# Patient Record
Sex: Male | Born: 1935 | Race: White | Hispanic: No | Marital: Married | Smoking: Former smoker
Health system: Southern US, Community
[De-identification: ages and names within clinical notes are randomized; demographics above are authoritative.]

## PROBLEM LIST (undated history)

## (undated) DIAGNOSIS — M1990 Unspecified osteoarthritis, unspecified site: Secondary | ICD-10-CM

## (undated) DIAGNOSIS — D696 Thrombocytopenia, unspecified: Secondary | ICD-10-CM

## (undated) DIAGNOSIS — F1020 Alcohol dependence, uncomplicated: Secondary | ICD-10-CM

## (undated) DIAGNOSIS — I82409 Acute embolism and thrombosis of unspecified deep veins of unspecified lower extremity: Secondary | ICD-10-CM

## (undated) DIAGNOSIS — K439 Ventral hernia without obstruction or gangrene: Secondary | ICD-10-CM

## (undated) DIAGNOSIS — I739 Peripheral vascular disease, unspecified: Secondary | ICD-10-CM

## (undated) DIAGNOSIS — C801 Malignant (primary) neoplasm, unspecified: Secondary | ICD-10-CM

## (undated) DIAGNOSIS — N320 Bladder-neck obstruction: Secondary | ICD-10-CM

## (undated) DIAGNOSIS — I509 Heart failure, unspecified: Secondary | ICD-10-CM

## (undated) DIAGNOSIS — E785 Hyperlipidemia, unspecified: Secondary | ICD-10-CM

## (undated) DIAGNOSIS — F17200 Nicotine dependence, unspecified, uncomplicated: Secondary | ICD-10-CM

## (undated) DIAGNOSIS — I779 Disorder of arteries and arterioles, unspecified: Secondary | ICD-10-CM

## (undated) DIAGNOSIS — E119 Type 2 diabetes mellitus without complications: Secondary | ICD-10-CM

## (undated) DIAGNOSIS — I10 Essential (primary) hypertension: Secondary | ICD-10-CM

## (undated) DIAGNOSIS — I2510 Atherosclerotic heart disease of native coronary artery without angina pectoris: Secondary | ICD-10-CM

## (undated) DIAGNOSIS — R5381 Other malaise: Secondary | ICD-10-CM

## (undated) DIAGNOSIS — R296 Repeated falls: Secondary | ICD-10-CM

## (undated) DIAGNOSIS — W19XXXA Unspecified fall, initial encounter: Secondary | ICD-10-CM

## (undated) DIAGNOSIS — I639 Cerebral infarction, unspecified: Secondary | ICD-10-CM

## (undated) HISTORY — DX: Ventral hernia without obstruction or gangrene: K43.9

## (undated) HISTORY — DX: Nicotine dependence, unspecified, uncomplicated: F17.200

## (undated) HISTORY — DX: Hyperlipidemia, unspecified: E78.5

## (undated) HISTORY — DX: Type 2 diabetes mellitus without complications: E11.9

## (undated) HISTORY — PX: PROSTATE SURGERY: SHX751

## (undated) HISTORY — DX: Alcohol dependence, uncomplicated: F10.20

## (undated) HISTORY — DX: Bladder-neck obstruction: N32.0

## (undated) HISTORY — DX: Essential (primary) hypertension: I10

## (undated) HISTORY — DX: Peripheral vascular disease, unspecified: I73.9

## (undated) HISTORY — DX: Cerebral infarction, unspecified: I63.9

## (undated) HISTORY — DX: Atherosclerotic heart disease of native coronary artery without angina pectoris: I25.10

## (undated) HISTORY — DX: Malignant (primary) neoplasm, unspecified: C80.1

## (undated) HISTORY — DX: Unspecified osteoarthritis, unspecified site: M19.90

## (undated) HISTORY — PX: FOOT SURGERY: SHX648

## (undated) HISTORY — DX: Acute embolism and thrombosis of unspecified deep veins of unspecified lower extremity: I82.409

## (undated) HISTORY — DX: Unspecified fall, initial encounter: W19.XXXA

## (undated) HISTORY — DX: Thrombocytopenia, unspecified: D69.6

## (undated) HISTORY — DX: Disorder of arteries and arterioles, unspecified: I77.9

## (undated) HISTORY — DX: Other malaise: R53.81

## (undated) HISTORY — DX: Repeated falls: R29.6

---

## 1979-03-03 HISTORY — PX: SHOULDER FUSION SURGERY: SHX775

## 1996-07-02 DIAGNOSIS — I639 Cerebral infarction, unspecified: Secondary | ICD-10-CM

## 1996-07-02 HISTORY — PX: CAROTID ENDARTERECTOMY: SUR193

## 1996-07-02 HISTORY — DX: Cerebral infarction, unspecified: I63.9

## 1998-08-23 ENCOUNTER — Encounter (HOSPITAL_COMMUNITY): Admission: RE | Admit: 1998-08-23 | Discharge: 1998-11-21 | Payer: Self-pay | Admitting: *Deleted

## 1999-09-08 ENCOUNTER — Encounter (INDEPENDENT_AMBULATORY_CARE_PROVIDER_SITE_OTHER): Payer: Self-pay

## 1999-09-08 ENCOUNTER — Inpatient Hospital Stay (HOSPITAL_COMMUNITY): Admission: EM | Admit: 1999-09-08 | Discharge: 1999-09-12 | Payer: Self-pay | Admitting: Emergency Medicine

## 1999-09-08 ENCOUNTER — Encounter: Payer: Self-pay | Admitting: Gastroenterology

## 1999-09-08 ENCOUNTER — Encounter: Payer: Self-pay | Admitting: Emergency Medicine

## 1999-09-08 ENCOUNTER — Encounter: Payer: Self-pay | Admitting: Pulmonary Disease

## 2000-12-26 ENCOUNTER — Other Ambulatory Visit: Admission: RE | Admit: 2000-12-26 | Discharge: 2000-12-26 | Payer: Self-pay | Admitting: Urology

## 2000-12-26 ENCOUNTER — Encounter (INDEPENDENT_AMBULATORY_CARE_PROVIDER_SITE_OTHER): Payer: Self-pay | Admitting: Specialist

## 2001-07-02 HISTORY — PX: CAROTID ENDARTERECTOMY: SUR193

## 2001-10-20 ENCOUNTER — Encounter: Payer: Self-pay | Admitting: Vascular Surgery

## 2001-10-22 ENCOUNTER — Ambulatory Visit (HOSPITAL_COMMUNITY): Admission: RE | Admit: 2001-10-22 | Discharge: 2001-10-22 | Payer: Self-pay | Admitting: Vascular Surgery

## 2001-10-27 ENCOUNTER — Encounter (INDEPENDENT_AMBULATORY_CARE_PROVIDER_SITE_OTHER): Payer: Self-pay | Admitting: Specialist

## 2001-10-27 ENCOUNTER — Inpatient Hospital Stay (HOSPITAL_COMMUNITY): Admission: RE | Admit: 2001-10-27 | Discharge: 2001-10-28 | Payer: Self-pay | Admitting: Vascular Surgery

## 2004-02-15 ENCOUNTER — Ambulatory Visit: Admission: RE | Admit: 2004-02-15 | Discharge: 2004-05-15 | Payer: Self-pay | Admitting: Radiation Oncology

## 2004-04-05 ENCOUNTER — Ambulatory Visit (HOSPITAL_COMMUNITY): Admission: RE | Admit: 2004-04-05 | Discharge: 2004-04-05 | Payer: Self-pay | Admitting: Urology

## 2004-04-05 ENCOUNTER — Ambulatory Visit (HOSPITAL_BASED_OUTPATIENT_CLINIC_OR_DEPARTMENT_OTHER): Admission: RE | Admit: 2004-04-05 | Discharge: 2004-04-05 | Payer: Self-pay | Admitting: Urology

## 2004-06-21 ENCOUNTER — Ambulatory Visit: Payer: Self-pay | Admitting: Internal Medicine

## 2004-07-26 ENCOUNTER — Ambulatory Visit: Payer: Self-pay | Admitting: Internal Medicine

## 2004-08-02 ENCOUNTER — Ambulatory Visit: Payer: Self-pay | Admitting: Internal Medicine

## 2004-10-06 ENCOUNTER — Ambulatory Visit: Payer: Self-pay | Admitting: Internal Medicine

## 2005-04-23 ENCOUNTER — Ambulatory Visit (HOSPITAL_COMMUNITY): Admission: RE | Admit: 2005-04-23 | Discharge: 2005-04-23 | Payer: Self-pay | Admitting: Vascular Surgery

## 2005-05-02 ENCOUNTER — Ambulatory Visit (HOSPITAL_COMMUNITY): Admission: RE | Admit: 2005-05-02 | Discharge: 2005-05-02 | Payer: Self-pay | Admitting: Vascular Surgery

## 2005-08-23 ENCOUNTER — Encounter: Admission: RE | Admit: 2005-08-23 | Discharge: 2005-11-21 | Payer: Self-pay | Admitting: Family Medicine

## 2007-06-04 ENCOUNTER — Encounter: Admission: RE | Admit: 2007-06-04 | Discharge: 2007-09-02 | Payer: Self-pay | Admitting: Family Medicine

## 2007-09-02 ENCOUNTER — Emergency Department (HOSPITAL_COMMUNITY): Admission: EM | Admit: 2007-09-02 | Discharge: 2007-09-02 | Payer: Self-pay | Admitting: Emergency Medicine

## 2010-05-08 ENCOUNTER — Ambulatory Visit (HOSPITAL_COMMUNITY): Admission: RE | Admit: 2010-05-08 | Discharge: 2010-05-08 | Payer: Self-pay | Admitting: Cardiology

## 2010-05-15 ENCOUNTER — Ambulatory Visit: Payer: Self-pay | Admitting: Surgery

## 2010-05-16 ENCOUNTER — Encounter: Payer: Self-pay | Admitting: Internal Medicine

## 2010-05-16 ENCOUNTER — Ambulatory Visit (HOSPITAL_COMMUNITY): Admission: RE | Admit: 2010-05-16 | Discharge: 2010-05-16 | Payer: Self-pay | Admitting: Surgery

## 2010-05-23 ENCOUNTER — Ambulatory Visit: Payer: Self-pay | Admitting: Vascular Surgery

## 2010-05-23 ENCOUNTER — Encounter: Payer: Self-pay | Admitting: Internal Medicine

## 2010-05-24 DIAGNOSIS — R011 Cardiac murmur, unspecified: Secondary | ICD-10-CM | POA: Insufficient documentation

## 2010-05-24 DIAGNOSIS — E119 Type 2 diabetes mellitus without complications: Secondary | ICD-10-CM | POA: Insufficient documentation

## 2010-05-24 DIAGNOSIS — I10 Essential (primary) hypertension: Secondary | ICD-10-CM | POA: Insufficient documentation

## 2010-05-24 DIAGNOSIS — I6350 Cerebral infarction due to unspecified occlusion or stenosis of unspecified cerebral artery: Secondary | ICD-10-CM | POA: Insufficient documentation

## 2010-05-24 DIAGNOSIS — E785 Hyperlipidemia, unspecified: Secondary | ICD-10-CM | POA: Insufficient documentation

## 2010-05-24 DIAGNOSIS — I2510 Atherosclerotic heart disease of native coronary artery without angina pectoris: Secondary | ICD-10-CM | POA: Insufficient documentation

## 2010-05-24 DIAGNOSIS — C61 Malignant neoplasm of prostate: Secondary | ICD-10-CM

## 2010-05-26 ENCOUNTER — Ambulatory Visit: Payer: Self-pay | Admitting: Internal Medicine

## 2010-05-26 DIAGNOSIS — R059 Cough, unspecified: Secondary | ICD-10-CM | POA: Insufficient documentation

## 2010-05-26 DIAGNOSIS — R05 Cough: Secondary | ICD-10-CM

## 2010-06-13 ENCOUNTER — Ambulatory Visit: Payer: Self-pay | Admitting: Surgery

## 2010-06-13 ENCOUNTER — Ambulatory Visit: Payer: Self-pay | Admitting: Internal Medicine

## 2010-06-13 DIAGNOSIS — F17200 Nicotine dependence, unspecified, uncomplicated: Secondary | ICD-10-CM

## 2010-07-02 HISTORY — PX: CORONARY ARTERY BYPASS GRAFT: SHX141

## 2010-07-26 LAB — CBC
HCT: 47.2 % (ref 39.0–52.0)
Hemoglobin: 15.9 g/dL (ref 13.0–17.0)
MCHC: 33.7 g/dL (ref 30.0–36.0)
RBC: 5.05 MIL/uL (ref 4.22–5.81)
WBC: 10.5 10*3/uL (ref 4.0–10.5)

## 2010-07-26 LAB — URINALYSIS, ROUTINE W REFLEX MICROSCOPIC
Ketones, ur: NEGATIVE mg/dL
Leukocytes, UA: NEGATIVE
Nitrite: NEGATIVE
Protein, ur: NEGATIVE mg/dL
pH: 6 (ref 5.0–8.0)

## 2010-07-26 LAB — COMPREHENSIVE METABOLIC PANEL
ALT: 34 U/L (ref 0–53)
Albumin: 4.2 g/dL (ref 3.5–5.2)
Alkaline Phosphatase: 84 U/L (ref 39–117)
Calcium: 9.8 mg/dL (ref 8.4–10.5)
GFR calc Af Amer: >60 mL/min (ref >60)
Glucose, Bld: 317 mg/dL — ABNORMAL HIGH (ref 70–99)
Potassium: 5.1 mEq/L (ref 3.5–5.1)
Sodium: 137 mEq/L (ref 135–145)
Total Protein: 6.2 g/dL (ref 6.0–8.3)

## 2010-07-26 LAB — BLOOD GAS, ARTERIAL
Bicarbonate: 23.9 mEq/L (ref 20.0–24.0)
FIO2: 0.21 %
Patient temperature: 98.6
TCO2: 25 mmol/L (ref 0–100)
pH, Arterial: 7.409 (ref 7.350–7.450)
pO2, Arterial: 57.7 mmHg — ABNORMAL LOW (ref 80.0–100.0)

## 2010-07-26 LAB — APTT: aPTT: 30 seconds (ref 24–37)

## 2010-07-26 LAB — URINE MICROSCOPIC-ADD ON

## 2010-07-26 LAB — HEMOGLOBIN A1C: Hgb A1c MFr Bld: 9.6 % — ABNORMAL HIGH (ref <5.7)

## 2010-07-26 LAB — PROTIME-INR
INR: 0.89 (ref 0.00–1.49)
Prothrombin Time: 12.3 seconds (ref 11.6–15.2)

## 2010-07-26 LAB — SURGICAL PCR SCREEN: MRSA, PCR: NEGATIVE

## 2010-07-27 ENCOUNTER — Inpatient Hospital Stay (HOSPITAL_COMMUNITY)
Admission: RE | Admit: 2010-07-27 | Discharge: 2010-08-01 | Disposition: A | Discharge status: Home / Self Care | Payer: Medicare Other | Source: Home / Self Care | Attending: Surgery | Admitting: Surgery

## 2010-07-27 HISTORY — PX: PR VEIN BYPASS GRAFT,AORTO-FEM-POP: 35551

## 2010-07-27 HISTORY — PX: CAROTID ENDARTERECTOMY: SUR193

## 2010-07-27 LAB — GLUCOSE, POCT (MANUAL RESULT ENTRY): Operator id: 132841

## 2010-07-27 LAB — PLATELET COUNT: Platelets: 123 10*3/uL — ABNORMAL LOW (ref 150–400)

## 2010-07-27 LAB — POCT I-STAT 4, (NA,K, GLUC, HGB,HCT)
Glucose, Bld: 162 mg/dL — ABNORMAL HIGH (ref 70–99)
HCT: 34 % — ABNORMAL LOW (ref 39.0–52.0)
HCT: 28 % — ABNORMAL LOW (ref 39.0–52.0)
Hemoglobin: 13.3 g/dL (ref 13.0–17.0)
Hemoglobin: 8.8 g/dL — ABNORMAL LOW (ref 13.0–17.0)
Hemoglobin: 9.5 g/dL — ABNORMAL LOW (ref 13.0–17.0)
Potassium: 4.7 mEq/L (ref 3.5–5.1)
Potassium: 4.4 mEq/L (ref 3.5–5.1)
Potassium: 4.4 mEq/L (ref 3.5–5.1)
Sodium: 140 mEq/L (ref 135–145)
Sodium: 137 mEq/L (ref 135–145)
Sodium: 140 mEq/L (ref 135–145)

## 2010-07-27 LAB — POCT I-STAT 3, ART BLOOD GAS (G3+)
Acid-base deficit: 4 mmol/L — ABNORMAL HIGH (ref 0.0–2.0)
Acid-base deficit: 4 mmol/L — ABNORMAL HIGH (ref 0.0–2.0)
Bicarbonate: 25.7 mEq/L — ABNORMAL HIGH (ref 20.0–24.0)
Bicarbonate: 22.4 mEq/L (ref 20.0–24.0)
Patient temperature: 37.2
pCO2 arterial: 47.4 mmHg — ABNORMAL HIGH (ref 35.0–45.0)
pH, Arterial: 7.342 — ABNORMAL LOW (ref 7.350–7.450)
pH, Arterial: 7.285 — ABNORMAL LOW (ref 7.350–7.450)
pH, Arterial: 7.286 — ABNORMAL LOW (ref 7.350–7.450)
pO2, Arterial: 296 mmHg — ABNORMAL HIGH (ref 80.0–100.0)
pO2, Arterial: 68 mmHg — ABNORMAL LOW (ref 80.0–100.0)

## 2010-07-27 LAB — GLUCOSE, CAPILLARY
Glucose-Capillary: 94 mg/dL (ref 70–99)
Glucose-Capillary: 96 mg/dL (ref 70–99)
Glucose-Capillary: 115 mg/dL — ABNORMAL HIGH (ref 70–99)

## 2010-07-27 LAB — HEMOGLOBIN AND HEMATOCRIT, BLOOD
HCT: 29.1 % — ABNORMAL LOW (ref 39.0–52.0)
Hemoglobin: 10.1 g/dL — ABNORMAL LOW (ref 13.0–17.0)

## 2010-07-27 LAB — CBC
HCT: 35.9 % — ABNORMAL LOW (ref 39.0–52.0)
Hemoglobin: 12.4 g/dL — ABNORMAL LOW (ref 13.0–17.0)
MCH: 32.1 pg (ref 26.0–34.0)
MCV: 95 fL (ref 78.0–100.0)
Platelets: 118 10*3/uL — ABNORMAL LOW (ref 150–400)
RBC: 3.86 MIL/uL — ABNORMAL LOW (ref 4.22–5.81)
RBC: 3.78 MIL/uL — ABNORMAL LOW (ref 4.22–5.81)
RDW: 14.4 % (ref 11.5–15.5)
WBC: 15.6 10*3/uL — ABNORMAL HIGH (ref 4.0–10.5)

## 2010-07-27 LAB — PROTIME-INR
INR: 1.25 (ref 0.00–1.49)
Prothrombin Time: 15.9 seconds — ABNORMAL HIGH (ref 11.6–15.2)

## 2010-07-27 LAB — POCT I-STAT, CHEM 8
BUN: 18 mg/dL (ref 6–23)
Calcium, Ion: 1.21 mmol/L (ref 1.12–1.32)
Chloride: 110 mEq/L (ref 96–112)
HCT: 35 % — ABNORMAL LOW (ref 39.0–52.0)
Sodium: 140 mEq/L (ref 135–145)
TCO2: 24 mmol/L (ref 0–100)

## 2010-07-27 LAB — CREATININE, SERUM: GFR calc Af Amer: >60 mL/min (ref >60)

## 2010-07-28 LAB — GLUCOSE, CAPILLARY
Glucose-Capillary: 219 mg/dL — ABNORMAL HIGH (ref 70–99)
Glucose-Capillary: 224 mg/dL — ABNORMAL HIGH (ref 70–99)
Glucose-Capillary: 166 mg/dL — ABNORMAL HIGH (ref 70–99)
Glucose-Capillary: 235 mg/dL — ABNORMAL HIGH (ref 70–99)
Glucose-Capillary: 250 mg/dL — ABNORMAL HIGH (ref 70–99)

## 2010-07-28 LAB — CBC
HCT: 35.3 % — ABNORMAL LOW (ref 39.0–52.0)
HCT: 34.8 % — ABNORMAL LOW (ref 39.0–52.0)
Hemoglobin: 11.6 g/dL — ABNORMAL LOW (ref 13.0–17.0)
MCH: 31.4 pg (ref 26.0–34.0)
MCV: 96.7 fL (ref 78.0–100.0)
Platelets: 94 10*3/uL — ABNORMAL LOW (ref 150–400)
RBC: 3.68 MIL/uL — ABNORMAL LOW (ref 4.22–5.81)
RBC: 3.6 MIL/uL — ABNORMAL LOW (ref 4.22–5.81)
RDW: 14.6 % (ref 11.5–15.5)
WBC: 12.6 10*3/uL — ABNORMAL HIGH (ref 4.0–10.5)

## 2010-07-28 LAB — BASIC METABOLIC PANEL
CO2: 25 mEq/L (ref 19–32)
Chloride: 108 mEq/L (ref 96–112)
Glucose, Bld: 232 mg/dL — ABNORMAL HIGH (ref 70–99)
Potassium: 4.9 mEq/L (ref 3.5–5.1)
Sodium: 141 mEq/L (ref 135–145)

## 2010-07-28 LAB — POCT I-STAT 3, ART BLOOD GAS (G3+)
Acid-base deficit: 3 mmol/L — ABNORMAL HIGH (ref 0.0–2.0)
Bicarbonate: 23.3 mEq/L (ref 20.0–24.0)
pCO2 arterial: 46.2 mmHg — ABNORMAL HIGH (ref 35.0–45.0)
pH, Arterial: 7.314 — ABNORMAL LOW (ref 7.350–7.450)
pO2, Arterial: 67 mmHg — ABNORMAL LOW (ref 80.0–100.0)

## 2010-07-28 LAB — TYPE AND SCREEN
ABO/RH(D): O POS
Unit division: 0
Unit division: 0

## 2010-07-28 LAB — MAGNESIUM: Magnesium: 2.4 mg/dL (ref 1.5–2.5)

## 2010-07-28 NOTE — Op Note (Signed)
NAME:  [name] [name], [name] [name]             ACCOUNT NO.:  [id number]  MEDICAL RECORD NO.:  [id number]          PATIENT TYPE:  INP  LOCATION:  2306                         FACILITY:  [facility]  PHYSICIAN:  Larina Earthly, M.D.    DATE OF BIRTH:  April 07, 1935  DATE OF PROCEDURE:  07/27/2010 DATE OF DISCHARGE:                              OPERATIVE REPORT   PREOPERATIVE DIAGNOSES:  Severe asymptomatic right internal carotid artery stenosis and severe coronary artery disease.  POSTOPERATIVE DIAGNOSES:  Severe asymptomatic right internal carotid artery stenosis and severe coronary artery disease.  PROCEDURE:  Right carotid endarterectomy, Dacron patch angioplasty.  SURGEON:  Larina Earthly, MD  ASSISTANT:  Rowe Clack, PA-C.  ANESTHESIA:  General tracheal.  COMPLICATIONS:  None.  DISPOSITION:  The patient will then subsequently undergo coronary artery bypass grafting, which will be dictated in separate note by Dr. Evelene Croon  PROCEDURE IN DETAIL:  The patient was taken to operating room, placed in supine position where the area of the right neck, chest, both legs were prepped and draped in usual sterile fashion.  Incision was made in the anterior sternocleidomastoid and carried down through the platysma with electrocautery.  The sternocleidomastoid reflected posteriorly and the carotid sheath was opened.  Facial vein was ligated with 2-0 silk ties and divided.  The common carotid artery was encircled with umbilical tape Rumel tourniquet.  Dissection was carried down to the bifurcation. The superior thyroid artery was encircled with 2-0 silk Potts tie.  The external carotid was encircled with the blue vessel loop, and the internal carotid was encircled with an umbilical tape and Rumel tourniquet.  Hypoglossal and vagus nerves were identified and preserved. The patient was given 9000 units of intravenous heparin.  After adequate circulation time, the internal, external, and common carotid  arteries were occluded.  The common carotid arteries were opened with an 11 blade and extended longitudinally with Potts scissors through the plaque onto the internal carotid artery.  The patient had extensive calcified plaque at the internal carotid artery.  The artery above this was normal.  A 10 shunt was passed up the internal carotid artery allowed to back bleed and then down the common carotid were secured with Rumel tourniquets. The endarterectomy was begun on the common carotid artery, and the plaque was divided proximally with Potts scissors.  The endarterectomy carried down to the bifurcation.  The external carotid endarterectomized with eversion technique, and the internal carotid endarterectomized in open fashion.  Remaining atheromatous debris was removed from endarterectomy plane.  A Hemashield Finesse Dacron patch was brought onto the field and sewn as a patch angioplasty with a running 6-0 Prolene suture.  Prior to completion of the anastomosis, shunt was removed and the usual flushing maneuvers were undertaken.  Anastomosis completed and the flow restored first to the external and internal carotid artery.  Excellent flow characteristics were noted with hand- held Dopplers in the internal and external carotid arteries.  The patient was not given any protamines since he was not going to undergo coronary artery bypass grafting.  A Surgicel and Ray-Tec sponge was placed over the anastomosis, and the skin  was temporary closed with 2-0 nylon mattress sutures.  The patient then will undergo a coronary artery bypass grafting, which will be dictated as a separate note by Dr. Laneta Simmers.     Larina Earthly, M.D.     TFE/MEDQ  D:  07/27/2010  T:  07/28/2010  Job:  [id number]  cc:   Cristy Hilts. Jacinto Halim, MD  Electronically Signed by Kaity Pitstick M.D. on 07/28/2010 03:37:36 PM

## 2010-07-29 LAB — GLUCOSE, CAPILLARY
Glucose-Capillary: 183 mg/dL — ABNORMAL HIGH (ref 70–99)
Glucose-Capillary: 262 mg/dL — ABNORMAL HIGH (ref 70–99)
Glucose-Capillary: 325 mg/dL — ABNORMAL HIGH (ref 70–99)
Glucose-Capillary: 192 mg/dL — ABNORMAL HIGH (ref 70–99)

## 2010-07-29 LAB — CBC
HCT: 35.4 % — ABNORMAL LOW (ref 39.0–52.0)
Hemoglobin: 11.5 g/dL — ABNORMAL LOW (ref 13.0–17.0)
MCV: 97.3 fL (ref 78.0–100.0)
RBC: 3.64 MIL/uL — ABNORMAL LOW (ref 4.22–5.81)
WBC: 14.1 10*3/uL — ABNORMAL HIGH (ref 4.0–10.5)

## 2010-07-29 LAB — BASIC METABOLIC PANEL
BUN: 21 mg/dL (ref 6–23)
CO2: 29 mEq/L (ref 19–32)
Chloride: 101 mEq/L (ref 96–112)
Glucose, Bld: 210 mg/dL — ABNORMAL HIGH (ref 70–99)
Potassium: 4.8 mEq/L (ref 3.5–5.1)

## 2010-07-30 LAB — GLUCOSE, CAPILLARY: Glucose-Capillary: 151 mg/dL — ABNORMAL HIGH (ref 70–99)

## 2010-07-30 LAB — CBC
HCT: 34.6 % — ABNORMAL LOW (ref 39.0–52.0)
Hemoglobin: 11.2 g/dL — ABNORMAL LOW (ref 13.0–17.0)
MCH: 31.2 pg (ref 26.0–34.0)
MCHC: 32.4 g/dL (ref 30.0–36.0)
MCV: 96.4 fL (ref 78.0–100.0)
Platelets: 99 10*3/uL — ABNORMAL LOW (ref 150–400)
RBC: 3.59 MIL/uL — ABNORMAL LOW (ref 4.22–5.81)
RDW: 14.1 % (ref 11.5–15.5)
WBC: 11.6 10*3/uL — ABNORMAL HIGH (ref 4.0–10.5)

## 2010-07-30 LAB — COMPREHENSIVE METABOLIC PANEL
ALT: 28 U/L (ref 0–53)
AST: 29 U/L (ref 0–37)
Albumin: 2.8 g/dL — ABNORMAL LOW (ref 3.5–5.2)
Alkaline Phosphatase: 47 U/L (ref 39–117)
BUN: 25 mg/dL — ABNORMAL HIGH (ref 6–23)
CO2: 31 mEq/L (ref 19–32)
Calcium: 9 mg/dL (ref 8.4–10.5)
Chloride: 96 mEq/L (ref 96–112)
Creatinine, Ser: 0.88 mg/dL (ref 0.4–1.5)
GFR calc Af Amer: >60 mL/min (ref >60)
GFR calc non Af Amer: >60 mL/min (ref >60)
Glucose, Bld: 116 mg/dL — ABNORMAL HIGH (ref 70–99)
Potassium: 3.9 mEq/L (ref 3.5–5.1)
Sodium: 137 mEq/L (ref 135–145)
Total Bilirubin: 0.8 mg/dL (ref 0.3–1.2)
Total Protein: 5.6 g/dL — ABNORMAL LOW (ref 6.0–8.3)

## 2010-07-31 LAB — CBC
HCT: 32.9 % — ABNORMAL LOW (ref 39.0–52.0)
Hemoglobin: 10.7 g/dL — ABNORMAL LOW (ref 13.0–17.0)
MCH: 31.2 pg (ref 26.0–34.0)
MCHC: 32.5 g/dL (ref 30.0–36.0)
MCV: 95.9 fL (ref 78.0–100.0)
Platelets: 141 10*3/uL — ABNORMAL LOW (ref 150–400)
RBC: 3.43 MIL/uL — ABNORMAL LOW (ref 4.22–5.81)
RDW: 14.1 % (ref 11.5–15.5)
WBC: 9.6 10*3/uL (ref 4.0–10.5)

## 2010-07-31 LAB — BASIC METABOLIC PANEL
BUN: 29 mg/dL — ABNORMAL HIGH (ref 6–23)
CO2: 31 mEq/L (ref 19–32)
Calcium: 9.1 mg/dL (ref 8.4–10.5)
Chloride: 96 mEq/L (ref 96–112)
Creatinine, Ser: 0.94 mg/dL (ref 0.4–1.5)
GFR calc Af Amer: >60 mL/min (ref >60)
GFR calc non Af Amer: >60 mL/min (ref >60)
Glucose, Bld: 121 mg/dL — ABNORMAL HIGH (ref 70–99)
Potassium: 3.6 mEq/L (ref 3.5–5.1)
Sodium: 138 mEq/L (ref 135–145)

## 2010-07-31 LAB — GLUCOSE, CAPILLARY
Glucose-Capillary: 121 mg/dL — ABNORMAL HIGH (ref 70–99)
Glucose-Capillary: 110 mg/dL — ABNORMAL HIGH (ref 70–99)
Glucose-Capillary: 69 mg/dL — ABNORMAL LOW (ref 70–99)
Glucose-Capillary: 167 mg/dL — ABNORMAL HIGH (ref 70–99)

## 2010-08-01 LAB — GLUCOSE, CAPILLARY: Glucose-Capillary: 130 mg/dL — ABNORMAL HIGH (ref 70–99)

## 2010-08-01 NOTE — Letter (Signed)
Summary: NP Consult/[facility]  NP Consult/[facility]   Imported By: Sherian Rein 06/07/2010 12:21:48  _____________________________________________________________________  External Attachment:    Type:   Image     Comment:   External Document

## 2010-08-01 NOTE — Assessment & Plan Note (Signed)
Summary: Pulmonary/ new pt eval     Visit Type:  Initial Consult Copy to:  Dr. Evelene Croon  CC:  DOE.  History of Present Illness: 46 yowm smoker for preop eval with marked abn on insp loop only 05/16/10 while on ace inhibitors.  May 26, 2010 ov cc  indolent onset persistent minimally productive cough x 5 years no worse in am's and notes mostly with activity,  doe x flat like at a [employer], doesn't try more than that.  No noct events.  Pt denies any significant sore throat, dysphagia, itching, sneezing,  nasal congestion or excess secretions,  fever, chills, sweats, unintended wt loss, pleuritic or exertional cp, hempoptysis, change in activity tolerance  orthopnea pnd or leg swelling Pt also denies any obvious fluctuation in symptoms with weather or environmental change or other alleviating or aggravating factors.       Current Medications (verified): 1)  Multivitamins  Tabs (Multiple Vitamin) .Marland Kitchen.. 1 Once Daily 2)  Metformin Hcl 500 Mg Tabs (Metformin Hcl) .Marland Kitchen.. 1 Every Am 3)  Amlodipine Besylate 10 Mg Tabs (Amlodipine Besylate) .Marland Kitchen.. 1 Every Am 4)  Glimepiride 2 Mg Tabs (Glimepiride) .Marland Kitchen.. 1 Every Am 5)  Atenolol 50 Mg Tabs (Atenolol) .Marland Kitchen.. 1 Two Times A Day 6)  Quinapril Hcl 40 Mg Tabs (Quinapril Hcl) .Marland Kitchen.. 1 1/2 Once Daily 7)  Fish Oil 300 Mg Caps (Omega-3 Fatty Acids) .Marland Kitchen.. 1 Two Times A Day 8)  Co-Q 10 Omega-3 Fish Oil  Caps (Coenzyme Q10-Fish Oil-Vit E) .Marland Kitchen.. 1 Once Daily 9)  Lantus 100 Unit/ml Soln (Insulin Glargine) .... 66 Units At Bedtime  Allergies (verified): 1)  ! Darvon 2)  ! Demerol  Past History:  Past Medical History: Hx of PROSTATE CANCER (ICD-185) DM (ICD-250.00) CARDIAC MURMUR (ICD-785.2) HYPERLIPIDEMIA (ICD-272.4) HYPERTENSION (ICD-401.9)      - Try off ACE May 26, 2010 for pseudowheeze/cough CVA (ICD-434.91) CAD (ICD-414.00)   - LHC  05/08/10  nl ef LVEDP 19, 3 vessel CAD PFT's 05/16/10  FEV1  3.12 (88%) ratio 76  DLCO 80% with severe truncation insp  loop  Family History: Reviewed history from 05/24/2010 and no changes required. MI- Father (deceased age 10) Brother (deceased age 19) Stroke- Brother, Mother  Social History: Married Retired 4 Sons Current smoker since age 57.  Smokes 3/4 ppd. No ETOH  Review of Systems       The patient complains of shortness of breath with activity, non-productive cough, sneezing, and joint stiffness or pain.  The patient denies shortness of breath at rest, productive cough, coughing up blood, chest pain, irregular heartbeats, acid heartburn, indigestion, loss of appetite, weight change, abdominal pain, difficulty swallowing, sore throat, tooth/dental problems, headaches, nasal congestion/difficulty breathing through nose, itching, ear ache, anxiety, depression, hand/feet swelling, rash, change in color of mucus, and fever.    Vital Signs:  Patient profile:   76 year old male Height:      70 inches Weight:      211.50 pounds BMI:     30.46 O2 Sat:      95 % on Room air Temp:     97.6 degrees F oral Pulse rate:   65 / minute BP sitting:   164 / 70  (left arm)  Vitals Entered By: Vernie Murders (May 26, 2010 10:32 AM)  O2 Flow:  Room air  Physical Exam  Additional Exam:  pleasant hoarse amb wm  wt 211 May 26, 2010 HEENT mild turbinate edema.  Oropharynx no thrush or excess pnd  or cobblestoning.  No JVD or cervical adenopathy. Mild accessory muscle hypertrophy. Trachea midline, nl thryroid. Chest was hyperinflated by percussion with diminished breath sounds and moderate increased exp time without wheeze. Hoover sign positive at mid inspiration. Regular rate and rhythm without murmur gallop or rub or increase P2 or edema.  Abd: no hsm, nl excursion. Ext warm without cyanosis or clubbing.     Impression & Recommendations:  Problem # 1:  COUGH (ICD-786.2)  The most common causes of chronic cough in immunocompetent adults include: upper airway cough syndrome (UACS), previously  referred to as postnasal drip syndrome,  caused by variety of rhinosinus conditions; (2) asthma; (3) GERD; (4) chronic bronchitis from cigarette smoking or other inhaled environmental irritants; (5) nonasthmatic eosinophilic bronchitis; and (6) bronchiectasis. These conditions, singly or in combination, have accounted for up to 94% of the causes of chronic cough in prospective studies.   This is most c/w  Classic Upper airway cough syndrome, so named because it's frequently impossible to sort out how much is  CR/sinusitis with freq throat clearing (which can be related to primary GERD)   vs  causing  secondary extra esophageal GERD from wide swings in gastric pressure that occur with throat clearing, promoting self use of mint and menthol lozenges that reduce the lower esophageal sphincter tone and exacerbate the problem further These are the same pts who not infrequently have failed to tolerate ace inhibitors,  dry powder inhalers or biphosphonates or report having reflux symptoms that don't respond to standard doses of PPI   He is on ace presently with classic upper airway obstruction on FV loop so  try off this first     Problem # 2:  HYPERTENSION (ICD-401.9)  The following medications were removed from the medication list:    Atenolol 50 Mg Tabs (Atenolol) .Marland Kitchen... 1 two times a day    Quinapril Hcl 40 Mg Tabs (Quinapril hcl) .Marland Kitchen... 1 1/2 once daily His updated medication list for this problem includes:    Amlodipine Besylate 10 Mg Tabs (Amlodipine besylate) .Marland Kitchen... 1 every am    Bystolic 10 Mg Tabs (Nebivolol hcl) ..... One tablet daily   ACE inhibitors are problematic in  pts with airway complaints because  even experienced pulmonologists can't always distinguish ace effects from copd/asthma.  By themselves they don't actually cause a problem, much like oxygen can't by itself start a fire, but they certainly serve as a powerful catalyst or enhancer for any "fire"  or inflammatory process in the  upper airway, be it caused by an ET  tube or more commonly reflux (especially in the obese or pts with known GERD or who are on biphoshonates).  In the era of ARB near equivalency until we have a better handle on the reversibility of the airway problem, it just makes sense to avoid ace entirely in the short run and then decide later, having established a level of airway control using a reasonable limited regimen, whether to add back ace but even then being very careful to observe the pt for worsening airway control and number of meds used/ needed to control symptoms.  In this case I would def hold ACE until post op and then repeat pft's to make sure insp trucation on pft's  has reesolved and if so not resume ace ever,  if not needs ent eval   Medications Added to Medication List This Visit: 1)  Multivitamins Tabs (Multiple vitamin) .Marland Kitchen.. 1 once daily 2)  Metformin Hcl 500 Mg  Tabs (Metformin hcl) .Marland Kitchen.. 1 every am 3)  Amlodipine Besylate 10 Mg Tabs (Amlodipine besylate) .Marland Kitchen.. 1 every am 4)  Glimepiride 2 Mg Tabs (Glimepiride) .Marland Kitchen.. 1 every am 5)  Atenolol 50 Mg Tabs (Atenolol) .Marland Kitchen.. 1 two times a day 6)  Quinapril Hcl 40 Mg Tabs (Quinapril hcl) .Marland Kitchen.. 1 1/2 once daily 7)  Fish Oil 300 Mg Caps (Omega-3 fatty acids) .Marland Kitchen.. 1 two times a day 8)  Co-q 10 Omega-3 Fish Oil Caps (Coenzyme q10-fish oil-vit e) .Marland Kitchen.. 1 once daily 9)  Lantus 100 Unit/ml Soln (Insulin glargine) .... 66 units at bedtime 10)  Bystolic 10 Mg Tabs (Nebivolol hcl) .... One tablet daily 11)  Endarby 80 Mg  .... One daily  Other Orders: New Patient Level V (16109)  Patient Instructions: 1)  Stop quinapil and atenolol and fish oil 2)  Bystolic 10mg  daily 3)  Endarby 80 mg one daily 4)  Please schedule a follow-up appointment in 2  weeks, sooner if needed

## 2010-08-03 NOTE — Assessment & Plan Note (Signed)
Summary: Pulmonary / summary f/u ov better off ace   Copy to:  Dr. Evelene Croon Primary Takeira Yanes/Referring Kawhi Diebold:  Dr. Lesly Rubenstein  CC:  DOE- some better.  History of Present Illness: 49 yowm smoker for preop eval with marked abn on insp loop only 05/16/10 while on ace inhibitors.  May 26, 2010 ov cc  indolent onset persistent minimally productive cough x 5 years no worse in am's and notes mostly with activity,  doe x flat like at a [employer], doesn't try more than that.  No noct events.  rec  stop ace and fish oil and atelnolol >  endarby 80 and bystolic 10 daily  June 13, 2010 ov still smoking  hoarseness cough and breathing some better for surgery before xmas.  able to lie flat without noct exac, minimal am sputum.  Pt denies any significant sore throat, dysphagia, itching, sneezing,  nasal congestion or excess secretions,  fever, chills, sweats, unintended wt loss, pleuritic or exertional cp, hempoptysis, change in activity tolerance  orthopnea pnd or leg swelling   Current Medications (verified): 1)  Multivitamins  Tabs (Multiple Vitamin) .Marland Kitchen.. 1 Once Daily 2)  Metformin Hcl 500 Mg Tabs (Metformin Hcl) .Marland Kitchen.. 1 Every Am 3)  Amlodipine Besylate 10 Mg Tabs (Amlodipine Besylate) .Marland Kitchen.. 1 Every Am 4)  Glimepiride 2 Mg Tabs (Glimepiride) .Marland Kitchen.. 1 Every Am 5)  Co-Q 10 Omega-3 Fish Oil  Caps (Coenzyme Q10-Fish Oil-Vit E) .Marland Kitchen.. 1 Once Daily 6)  Lantus 100 Unit/ml Soln (Insulin Glargine) .... 66 Units At Bedtime 7)  Bystolic 10 Mg  Tabs (Nebivolol Hcl) .... One Tablet Daily 8)  Endarby 80 Mg .... One Daily  Allergies (verified): 1)  ! Darvon 2)  ! Demerol  Past History:  Past Medical History: Hx of PROSTATE CANCER (ICD-185) DM (ICD-250.00) CARDIAC MURMUR (ICD-785.2) HYPERLIPIDEMIA (ICD-272.4) HYPERTENSION (ICD-401.9)      - Try off ACE May 26, 2010 for pseudowheeze/cough> resolved June 13, 2010  CVA (ICD-434.91) CAD (ICD-414.00)   - LHC  05/08/10  nl ef LVEDP 19, 3 vessel  CAD PFT's 05/16/10  FEV1  3.12 (88%) ratio 76  DLCO 80% with severe truncation insp loop  Vital Signs:  Patient profile:   76 year old male Weight:      210 pounds O2 Sat:      97 % on Room air Temp:     97.5 degrees F oral Pulse rate:   65 / minute BP sitting:   142 / 62  (left arm)  Vitals Entered By: Vernie Murders (June 13, 2010 12:26 PM)  O2 Flow:  Room air  Physical Exam  Additional Exam:  pleasant  amb wm no hoarsenss  wt 211 May 26, 2010 > 210  June 13, 2010  HEENT mild turbinate edema.  Oropharynx no thrush or excess pnd or cobblestoning.  No JVD or cervical adenopathy. Mild accessory muscle hypertrophy. Trachea midline, nl thryroid. Chest was hyperinflated by percussion with diminished breath sounds and moderate increased exp time without wheeze. Hoover sign positive at mid inspiration. Regular rate and rhythm without murmur gallop or rub or increase P2 or edema.  Abd: no hsm, nl excursion. Ext warm without cyanosis or clubbing.     Impression & Recommendations:  Problem # 1:  COUGH (ICD-786.2)  Marked improvement off ace   Classic Upper airway cough syndrome, so named because it's frequently impossible to sort out how much is  CR/sinusitis with freq throat clearing (which can be related to primary GERD)  vs  causing  secondary extra esophageal GERD from wide swings in gastric pressure that occur with throat clearing, promoting self use of mint and menthol lozenges that reduce the lower esophageal sphincter tone and exacerbate the problem further .  These are the same pts who not infrequently have failed to tolerate ace inhibitors,  dry powder inhalers or biphosphonates or report having reflux symptoms that don't respond to standard doses of PPI  So keep off ace indefinitely  Orders: Est. Patient Level IV (84132)  Problem # 2:  HYPERTENSION (ICD-401.9)  His updated medication list for this problem includes:    Amlodipine Besylate 10 Mg Tabs (Amlodipine  besylate) .Marland Kitchen... 1 every am    Bystolic 10 Mg Tabs (Nebivolol hcl) ..... One tablet daily  ok off ace.   Orders: Est. Patient Level IV (44010)  Problem # 3:  SMOKER (ICD-305.1)   I emphasized that although we never turn away smokers from the pulmonary clinic, we do ask that they understand that the recommendations that were made won't work nearly as well in the presence of continued cigarette exposure and we may reach a point where we can't help the patient if he/she can't quit smoking.    This is the most important aspect of his preop rx at this point but given excellent fev1 and minimal mucus clearnce problems it is not prohibitive at this point  Orders: Est. Patient Level IV (27253)  Patient Instructions: 1)  Avoid smoking completely between now and surgery 2)  After surgery your blood pressure meds can be adjusted by Dr Clarene Duke but I would avoid the class of ace inhibitors entirely since the only abnormality on your preop pft's is typical of an adverse ace effect. 3)  YOU ARE CLEARED FOR SURGERY

## 2010-08-03 NOTE — Letter (Signed)
Summary: [facility]  [facility]   Imported By: Sherian Rein 06/20/2010 07:51:16  _____________________________________________________________________  External Attachment:    Type:   Image     Comment:   External Document

## 2010-08-07 NOTE — Op Note (Signed)
NAME:  [name] [name], [name] [name]             ACCOUNT NO.:  [id number]  MEDICAL RECORD NO.:  [id number]          PATIENT TYPE:  INP  LOCATION:  2306                         FACILITY:  [facility]  PHYSICIAN:  Evelene Croon, M.D.     DATE OF BIRTH:  10-Mar-1935  DATE OF PROCEDURE:  07/27/2010 DATE OF DISCHARGE:                              OPERATIVE REPORT   PREOPERATIVE DIAGNOSIS:  Severe three-vessel coronary artery disease.  POSTOPERATIVE DIAGNOSIS:  Severe three-vessel coronary artery disease.  OPERATIVE PROCEDURE:  Median sternotomy, extracorporeal circulation, coronary artery bypass graft surgery x4 using a left internal mammary artery graft to the left anterior descending coronary artery, with a saphenous vein graft to the diagonal branch of the LAD, a saphenous vein graft to the obtuse marginal branch of the left circumflex coronary artery, and a saphenous vein graft to the posterior descending branch of the right coronary artery.  Endoscopic vein harvesting from the right leg.  ATTENDING SURGEON:  Evelene Croon, M.D.  ASSISTANT:  Rowe Clack, P.A.-C.  ANESTHESIA:  General endotracheal.  CLINICAL HISTORY:  This patient is a 76 year old gentleman with a long history of smoking as well as hypertension, diabetes, and hyperlipidemia.  He has 2 previous strokes secondary to chronic vascular disease.  He was noted to have an abnormal electrocardiogram and a new heart murmur by his primary physician, Dr. Clarene Duke.  He was referred to Dr. Jacinto Halim for evaluation.  He underwent a Cardiolite stress test, which showed normal left ventricular function.  There was mild inferior, inferior apical, and apical anterior ischemia.  An echocardiogram showed grade 3 diastolic dysfunction.  There was mild mitral regurgitation and no significant tricuspid regurgitation.  There was a hyperdynamic left ventricle.  He had a carotid Doppler examination, which showed high- grade right internal carotid artery  stenosis.  His previous left carotid endarterectomy site was patent.  He also had lower extremity vascular exam, which showed markedly reduced right ABI of 0.46.  He subsequently underwent cardiac catheterization on November 7, which showed significant three-vessel coronary artery disease.  The right coronary artery was a large vessel that was occluded in the proximal portion with filling of the distal vessel by collaterals from the LAD.  The left main was a large vessel and had moderate amount of calcification and 30-40% distal tapering.  The left circumflex had an ostial 60-70% stenosis followed by mid vessel 80-90% stenosis with a single moderate-sized marginal branch.  The distal left circumflex terminated as a posterolateral branch that communicated with the marginal.  The LAD had about 80-90% midvessel stenosis at the origin of the second diagonal branch, which itself had about 80% ostial stenosis.  The right and left internal mammary arteries were identified and there was no stenosis. There was about 80-90% right internal carotid artery stenosis. Abdominal aortogram was felt to show a high-grade left renal artery stenosis and moderate right renal artery stenosis.  Lower extremity arteriogram showed occlusion of the proximal superficial femoral artery on the right that reconstituted after a short segment occlusion.  On the left, the SFA was patent without any high-grade luminal stenosis.  After a review of the catheterization  examination of the patient, it was felt that coronary artery bypass graft surgery was the best treatment to prevent further ischemia and infarction.  He was seen by Dr. Arbie Cookey in consultation and was felt that concomitant right carotid endarterectomy was indicated to decrease his risk of perioperative stroke.  I discussed the operative procedure with the patient and his family including alternatives, benefits, and risks including, but not limited to bleeding,  blood transfusion, infection, stroke, myocardial infarction, graft failure, respiratory organ dysfunction, and death.  He understood and agreed to proceed.  OPERATIVE PROCEDURE:  The patient was taken to the operating room and placed on the table in supine position.  After induction of general endotracheal anesthesia, a Foley catheter was placed in the bladder using sterile technique.  Then the patient was positioned supine with the head turned towards the left to expose the right neck and the neck, chest, abdomen, and both lower extremities were prepped and draped in the usual sterile manner.  The right carotid endarterectomy was performed first by Dr. Arbie Cookey and will be dictated separately.  At the conclusion of his procedure, the wound was packed with a dry gauze and closed with temporary nylon mattress sutures.  Then attention was turned to his coronary bypass surgery.  The chest was opened through a median sternotomy incision.  The pericardium opened in the midline.  Examination of the heart showed good ventricular contractility.  The ascending aorta was of normal size and had no palpable plaques in it.  The patient did have a large amount of epicardial and mediastinal fat as well as some fat in the wall of the aorta.  Then the left internal mammary artery was harvested from chest wall to pedicle graft.  This was a medium-caliber vessel with excellent blood flow through it.  At the same time, a segment of greater saphenous vein was harvested from the right leg using endoscopic vein harvest technique.  This vein was of medium size and good quality.  Then the patient was heparinized.  When an adequate ACT was obtained, the distal ascending aorta was cannulated using a 20-French aortic cannula for arterial inflow.  Venous outflow was achieved using a two- stage venous cannula through the right atrial appendage.  An antegrade cardioplegia and vent cannula was inserted in the aortic  root.  The patient was placed on cardiopulmonary bypass and the distal coronary arteries were identified.  The LAD was a large graftable vessel.  The second diagonal branch was a moderate-size graftable vessel.  The obtuse marginal branch was a large graftable vessel with no significant distal disease in it.  The right coronary artery was diffusely diseased with plaque.  The proximal portion of the posterior descending artery was soft enough to graft.  Then the aorta was crossclamped and 1000 mL of cold blood antegrade cardioplegia was administered in the aortic root with quick arrest of the heart.  Systemic hypothermia to 20 degrees centigrade and topical hypothermia with iced saline was used.  A temperature probe was placed in the septum and an insulating pad in the pericardium.  The first distal anastomosis was performed in the posterior descending coronary artery.  The internal diameter of this vessel was about 1.6 mm. The conduit used was a segment of greater saphenous vein and the anastomosis performed in an end-to-side manner using continuous 7-0 Prolene suture.  Flow was noted through the graft and was excellent. Then a dose of cardioplegia was given down this vein graft.  The second distal  anastomosis was performed to the obtuse marginal branch.  The internal diameter of this vessel was about 1.75 mm.  The conduit used was a second segment of greater saphenous vein.  The anastomosis was performed in an end-to-side manner using continuous 7-0 Prolene suture.  The flow was noted through the graft and it was excellent.  Then the third distal anastomosis was performed to the diagonal branch. The internal diameter of this vessel was 1.6 mm.  The conduit used was a third segment of greater saphenous vein.  The anastomosis was performed in an end-to-side manner using continuous 7-0 Prolene suture.  Flow was noted through the graft and it was excellent.  Another dose  of cardioplegia was given down the vein graft and the aortic root.  The fourth distal anastomosis was performed in the distal LAD.  The internal diameter of this vessel was about 2 mm.  The conduit used was a left internal mammary graft and was brought out through an opening in the left pericardium anterior of the phrenic nerve.  It was anastomosed to the LAD in an end-to-side manner using continuous 8-0 Prolene suture. The pedicle was sutured to the epicardium with 6-0 Prolene sutures.  With the crossclamp in place, the three proximal vein graft anastomoses were performed to the mid ascending aorta in an end-to-side manner using continuous 6-0 Prolene suture.  Then, the clamp was removed from the mammary pedicle.  There was rapid warming of the ventricular septum and return of spontaneous ventricular fibrillation.  Crossclamp was removed with a time of 80 minutes.  There was spontaneous return of sinus rhythm.  The proximal and distal anastomoses appeared hemostatic while the grafts were satisfactory.  Graft markers were placed around the proximal anastomoses.  Two temporary right ventricular and right atrial pacing wires were placed and brought out through the skin.  When the patient rewarmed to 37 degrees centigrade, he was weaned from cardiopulmonary bypass on no inotropic agents.  Total bypass time was 99 minutes.  Cardiac function appeared excellent with a cardiac output of 6 liters per minute.  Protamine was given,  and the venous and the aortic cannulae were removed without difficulty.  Hemostasis was achieved. Three chest tubes were placed with two in the posterior pericardium along the left pleural space and one in the anterior mediastinum. Sternum was then closed with double #6 stainless steel wires.  The fascia was closed with continuous #1 Vicryl suture.  Subcutaneous tissue was closed with continuous 2-0 Vicryl and the skin with a 3-0 Vicryl subcuticular closure.  The  lower extremity vein harvest sites were closed in layers in a similar manner.  Then attention was turned to the right neck incision.  The nylon sutures were removed and the gauze packing removed from the wound.  The wound was irrigated with saline solution.  The carotid patch appeared hemostatic.  Then a 10-French flat Jackson-Pratt drain was brought through a separate stab incision and positioned in the carotid bed.  It was fixed to the skin with a silk suture.  Then the deep fascia was reapproximated with continuous 2-0 Vicryl suture.  The platysma muscle was reapproximated with continuous 3-0 Vicryl suture and the skin with a 3-0 Vicryl subcuticular skin closure.  The sponge, needles, and counts were correct according to the scrub nurse.  Dry sterile dressing applied over the incisions and around the chest tubes, which were hooked to Pleur-evac suction.  The right neck drain was placed to bulb suction. The patient remained  hemodynamically stable, was transported to the SICU in guarded, but stable condition.     Evelene Croon, M.D.     BB/MEDQ  D:  07/27/2010  T:  07/28/2010  Job:  [id number]  cc:   Cristy Hilts. Jacinto Halim, MD  Electronically Signed by Evelene Croon M.D. on 08/07/2010 95:62:13 AM

## 2010-08-07 NOTE — Discharge Summary (Signed)
NAME:  [name] [name], [name] [name]             ACCOUNT NO.:  [id number]  MEDICAL RECORD NO.:  [id number]          PATIENT TYPE:  INP  LOCATION:  2012                         FACILITY:  [facility]  PHYSICIAN:  Evelene Croon, M.D.     DATE OF BIRTH:  1934/12/08  DATE OF ADMISSION:  07/27/2010 DATE OF DISCHARGE:  08/01/2010                              DISCHARGE SUMMARY   PRIMARY DIAGNOSES: 1. Severe three-vessel coronary artery disease. 2. Severe asymptomatic right internal carotid artery stenosis.  IN-HOSPITAL DIAGNOSES: 1. Volume overload postoperatively. 2. Acute blood loss anemia postoperatively. 3. Postoperative pulmonary edema.  SECONDARY DIAGNOSES: 1. Uncontrolled diabetes mellitus with a hemoglobin A1c preoperatively     at 9.6. 2. Hypertension. 3. Hyperlipidemia. 4. 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  The patient had a redo left carotid     endarterectomy in 2003 by Dr. Arbie Cookey. 5. Status post prostate cancer with seed implants by Dr. Aldean Ast in     2006 and has undergone continued followup for that. 6. Status post left shoulder surgery with fusion in 1980s for     recurrent dislocation. 7. History of midline abdominal wall hernia. 8. Status post surgery on his foot. 9. Tobacco abuse.  IN-HOSPITAL OPERATIONS AND PROCEDURES: 1. Coronary artery bypass grafting x4 using left internal mammary     artery graft to left anterior descending coronary artery, saphenous     vein graft to diagonal branch of the left anterior descending     artery, saphenous vein graft to obtuse marginal branch of the left     circumflex coronary artery, saphenous vein graft to posterior     descending branch of the right coronary artery.  Endoscopic vein     harvesting from the right leg. 2. Right carotid endarterectomy with Dacron patch angioplasty.  HISTORY AND PHYSICAL AND HOSPITAL COURSE:   The patient is a 76 year old gentleman with a long history of smoking as well as hypertension, diabetes, and hyperlipidemia.  He has had two previous strokes secondary to chronic vascular disease.  He was noted to have an abnormal EKG and a new heart murmur by his primary physician, Dr. Clarene Duke.  He was referred to Dr. Jacinto Halim for evaluation.  The patient underwent Cardiolite stress test, which showed normal left ventricular function.  There was mild inferior, anterior apical, and apical anterior ischemia.  An echocardiogram showed grade 3 diastolic dysfunction.  There was mild mitral regurgitation.  No significant tricuspid regurgitation.  There was a hyperdynamic left ventricle.  The patient had a carotid Doppler examination, which showed high-grade right internal carotid artery stenosis.  His previous left carotid endarterectomy site was patent.  He also had lower extremity vascular exam, which showed markedly reduced right ABI of 0.46.  The patient subsequently underwent cardiac catheterization on May 09, 2011, which showed significant three- vessel coronary artery disease.  The patient was referred to Dr. Laneta Simmers. Dr. Laneta Simmers reviewed the patient's studies and evaluated the patient.  He discussed with the patient undergoing coronary artery bypass grafting. Risks  and benefits were discussed with the patient.  The patient acknowledged understanding and agreed to proceed.  Prior to scheduling surgery, the patient was referred to Dr. Arbie Cookey for further discussion of undergoing common and right carotid endarterectomy with coronary artery bypass grafting.  Dr. Arbie Cookey to saw and evaluate the patient.  He discussed with the patient risks and benefits of undergoing right carotid endarterectomy.  The patient acknowledged understanding and agreed to proceed.  The patient was scheduled for coronary artery bypass grafting with right carotid endarterectomy for July 27, 2010.  For further details  of the patient's past medical history and physical exam, please see dictated H and P.  The patient was taken to the operating room on July 27, 2010, where he underwent right carotid endarterectomy with Dacron patch angioplasty by Dr. Arbie Cookey as well as coronary artery bypass grafting x4 using a left internal mammary artery graft to left anterior descending coronary artery, saphenous vein graft to diagonal branch of the LAD, saphenous vein graft to obtuse marginal branch of the left circumflex coronary artery, saphenous vein graft to posterior descending branch of the right coronary artery.  Endoscopic vein harvesting from the right leg was done.  This was done by Dr. Laneta Simmers.  The patient tolerated these procedures well and was transferred to the Intensive Care Unit in stable condition.  Postoperatively, the patient was noted to be hemodynamically stable.  He was extubated in the evening of surgery.  Postextubation, the patient noted to be alert and oriented x4.  Neuro at baseline. Postoperatively, the patient was noted to be in normal sinus rhythm. Blood pressure is stable.  All drips were able to be weaned and discontinued.  He was started on low-dose beta-blocker.  The patient remained in normal sinus rhythm during his postoperative course.  His blood pressure has been well controlled on Lopressor.  Currently, an ACE inhibitor has not been started secondary to blood pressure well controlled on low-dose beta-blocker.  This can be reevaluated as an outpatient and can start ACE inhibitor as blood pressure allows.  Postextubation, the patient was placed on 2 liters of nasal cannula. Chest x-ray obtained on postop day #1 was stable.  The patient had minimal drainage from chest tubes and chest tubes were discontinued in routine fashion.  The patient does have a history of tobacco abuse.  He is currently still smoking preoperatively.  Followup chest x-ray on postop day #2 showed some  pulmonary edema.  The patient was diuresed. He was encouraged to use his incentive spirometer.  Repeat chest x-ray following day was improving.  Pt was able to be weaned off oxygen to maintain O2 sats greater than 88 percent.    The patient was educated to stop smoking and the importance of this.  The patient has said that he plans to quit smoking.  The patient's most recent chest x-ray obtained on July 31, 2010, showed small bilateral pleural effusion and to be stable.  The patient did have some mild acute blood loss anemia postoperatively. His hemoglobin and hematocrit followed closely.  They remained stable. The patient did not require any blood transfusions postoperatively. Daily weights were monitored on the patient.  He was receiving the Lasix for his pulmonary edema and also some mild volume overload.  The patient was below baseline weight prior to discharge to home.  His Lasix has been discontinued as well prior to discharge.  The patient is a known diabetic and blood sugars were followed closely postoperatively.  His hemoglobin A1c  was obtained preoperatively and to be 9.6.  He was started on Lantus insulin b.i.d. as well as NovoLog meal coverage.  He was then restarted back on his Amaryl.  Currently, the patient is well controlled on these three medicines.  We will plan to discharge the patient to home on p.o. Amaryl as well as Lantus b.i.d. coverage and NovoLog meal coverage.  Postoperatively, the patient has been up ambulating well with assistance.  He is tolerating diet well.  No nausea or vomiting noted.  All incisions were clean, dry and intact and healing well.  On postop day #5, the patient is afebrile and normal sinus rhythm. Blood pressure well controlled.  Patient was weaned off oxygen to maintain O2 saturations greater than 88 percent at rest and with ambulation.  Most recent lab work shows white blood cell count of 9.6, hemoglobin 10.7, hematocrit 32.9, platelet  count 141.  Sodium of 138, potassium 3.6, chloride of 96, bicarbonate 31, BUN of 29, creatinine of 0.94, glucose of 121.  The patient is ready for discharge to home today, August 01, 2010.  FOLLOWUP APPOINTMENTS:  A followup appointment has been arranged with Dr. Laneta Simmers for August 22, 2010 at 11:30 a.m.  The patient will need to obtain PA and lateral chest x-ray 30 minutes prior to this appointment. The patient will need to follow up with Dr. Jacinto Halim in 2 weeks.  He will need to contact his office to make these arrangements.  The patient also need to follow up with Dr. Arbie Cookey in 2 weeks.  He will need to contact his office to make these arrangements.  ACTIVITY:  The patient is instructed no driving until released to do so, no heavy lifting over 10 pounds.  He is told to ambulate 3-4 times per day, progress as tolerated and continue his breathing exercises.  INCISIONAL CARE:  The patient is told to shower, washing his incisions using soap and water.  He is to contact the office if he develops any drainage or opening from any of his incision sites.  DIET:  The patient is educated on diet to be low-fat, low-salt as well as diabetic diet.  DISCHARGE MEDICATIONS: 1. Enteric-coated aspirin 325 mg daily. 2. Guaifenesin 600 mg b.i.d. 3. NovoLog insulin 60 units subcu t.i.d. with meals. 4. Lopressor 25 mg b.i.d. 5. Crestor 20 mg daily. 6. Ultram 50 mg one to two tablets q. 4-6 h. p.r.n. pain. 7. Amaryl 2 mg daily. 8. Lantus insulin 30 units b.i.d. 9. Metformin 500 mg daily. 10.Multivitamin daily.     Sol Blazing, PA   ______________________________ Evelene Croon, M.D.    KMD/MEDQ  D:  08/01/2010  T:  08/02/2010  Job:  [id number]  cc:   Larina Earthly, M.D. Cristy Hilts. Jacinto Halim, MD  Electronically Signed by Cameron Proud PA on 08/02/2010 12:32:08 PM Electronically Signed by Evelene Croon M.D. on 08/07/2010 08:25:29 AM

## 2010-08-15 ENCOUNTER — Ambulatory Visit (INDEPENDENT_AMBULATORY_CARE_PROVIDER_SITE_OTHER): Payer: Medicare Other | Admitting: Vascular Surgery

## 2010-08-15 DIAGNOSIS — I6529 Occlusion and stenosis of unspecified carotid artery: Secondary | ICD-10-CM

## 2010-08-15 NOTE — Assessment & Plan Note (Signed)
OFFICE VISIT  [name], [name] DOB:  18-Jul-1934                                       08/15/2010 UVOZD#:[id number]  Patient presents today for follow-up of his right carotid endarterectomy done in conjunction with coronary artery bypass grafting by Dr. Laneta Simmers on __________.  He has done quite well since discharge from the hospital and has the usual amount of diminished stamina, otherwise no medical difficulties.  PHYSICAL EXAMINATION:  His right neck incision is well-healed as is the sternal incision.  He has the usual healing ridge.  He has no neurologic deficits.  I am quite pleased with his [name] [name] result, as is the patient.  Plan to see him again in 6 months with carotid duplex for follow-up of his endarterectomy.    Larina Earthly, M.D. Electronically Signed  TFE/MEDQ  D:  08/15/2010  T:  08/15/2010  Job:  [id number]  cc:   Cristy Hilts. Jacinto Halim, MD Evelene Croon, M.D. Fayrene Fearing Little

## 2010-08-21 ENCOUNTER — Other Ambulatory Visit: Payer: Self-pay | Admitting: Surgery

## 2010-08-21 DIAGNOSIS — I2510 Atherosclerotic heart disease of native coronary artery without angina pectoris: Secondary | ICD-10-CM

## 2010-08-22 ENCOUNTER — Encounter (INDEPENDENT_AMBULATORY_CARE_PROVIDER_SITE_OTHER): Payer: Self-pay | Admitting: Surgery

## 2010-08-22 ENCOUNTER — Ambulatory Visit
Admission: RE | Admit: 2010-08-22 | Discharge: 2010-08-22 | Disposition: A | Discharge status: Home / Self Care | Payer: Medicare Other | Source: Ambulatory Visit | Attending: Surgery | Admitting: Surgery

## 2010-08-22 DIAGNOSIS — I2510 Atherosclerotic heart disease of native coronary artery without angina pectoris: Secondary | ICD-10-CM

## 2010-08-22 NOTE — Assessment & Plan Note (Signed)
OFFICE VISIT  [name] [name], [name] [name] DOB:  12-Jul-1934                                        August 22, 2010 CHART #:  [id number]  The patient returned to my office today for followup status post coronary artery bypass graft surgery and right carotid endarterectomy by Dr. Gretta Began on July 27, 2010.  He had an uncomplicated postoperative course and he has been feeling fairly well since went home.  His only complaint is of some generalized weakness which he attributes to being due to Crestor.  He said he had been on Crestor and another statin in the past and had similar generalized weakness which resolved after these medications were stopped.  He denies any specific muscle or joint pain but says just kind feels weak all over.  PHYSICAL EXAMINATION:  VITAL SIGNS:  His blood pressure is 155/78, pulse is 92 and regular, respiratory rate is 20 and unlabored.  Oxygen saturation on room air is 94%.  GENERAL:  He looks well.  CARDIAC: Regular rate and rhythm with normal heart sounds.  LUNGS:  Clear. CHEST:  Incision is healing well and sternum is stable.  EXTREMITIES: His leg incision is healing well.  There is no peripheral edema.  MEDICATIONS: 1. Multivitamin daily. 2. Metformin 500 mg daily. 3. Glimepiride 2 mg daily. 4. Aspirin 325 mg daily. 5. Guaifenesin 600 mg b.i.d. 6. NovoLog 6 units subcu with meals. 7. Lantus 30 units b.i.d. 8. Lopressor 25 mg t.i.d. 9. Crestor 20 mg daily.  Follow up chest x-ray today shows clear lung fields and no pleural effusions.  IMPRESSION:  Overall, the patient appears making a good recovery following surgery.  His only real complaint is of this generalized weakness which he feels may be related to Crestor.  I told him he could discontinue his medication for awhile and see if his symptoms resolve. He can always restart it later.  I told him he could return to driving a car but should refrain from lifting anything  heavier than 10 pounds for a total of 3 months from date of surgery.  He will continue to follow up with Dr. Jacinto Halim and Dr. Aida Puffer.  He will contact me if he develops any problems with his incisions.  Evelene Croon, M.D. Electronically Signed  BB/MEDQ  D:  08/22/2010  T:  08/22/2010  Job:  [id number]  cc:   Cristy Hilts. Jacinto Halim, MD Aida Puffer

## 2010-11-14 NOTE — Assessment & Plan Note (Signed)
OFFICE VISIT   Buskey, Jermon  DOB:  October 28, 1934                                        June 13, 2010  CHART #:  [id number]   HISTORY:  The patient returned to my office today for followup.  He was  seen by me on May 15, 2010, for new patient consultation for severe  three-vessel coronary disease with an abnormal stress test.  He also has  a high-grade right internal carotid artery stenosis and has been seen  previously by Dr. Gretta Began.  When I saw him in the office last, he was  still smoking, but said that he cut down to about 6-8 cigarettes per  day.  I sent him to Dr. Sandrea Hughs for preoperative evaluation.  His  pulmonary function testing showed FEV1 of 2.75 which was 88% predicted,  FVC of 3.62 which was 84% predicted, and diffusion capacity of 90% of  predicted.  He saw Dr. Sherene Sires again in the office today and had his  medications adjusted with discontinuation of his quinapril, metoprolol,  and fish oil  and institution of 2 new medicines including Bystolic.  Dr. Sherene Sires felt that he was cleared to undergo surgery.   PHYSICAL EXAMINATION:  Today, his blood pressure is 142/62, pulse is 64  and regular, and respiratory rate is 18, unlabored.  Oxygen saturation  on room air is 95%.  He looks well.  Cardiac exam shows a regular rate  and rhythm with a grade 1-2/6 systolic murmur.  His lungs sound much  better with no wheezing or rhonchi.  There is no peripheral edema.   IMPRESSION:  The patient is ready to schedule coronary bypass and right  carotid endarterectomy.  I told him that I did not know if he would be  able to schedule that for the last week in December due to the holidays,  but if not, then we will plan to schedule it in January as quickly as  possible.  He is anxious to get this over with as soon as possible.  I  will have my  office coordinator scheduling with Dr. Bosie Helper office and we will call  the patient to let him know as  soon as we have a date.   Evelene Croon, M.D.  Electronically Signed   BB/MEDQ  D:  06/13/2010  T:  06/14/2010  Job:  [id number]   cc:   Larina Earthly, M.D.  Charlaine Dalton. Sherene Sires, MD, [facility]  Cristy Hilts. Jacinto Halim, MD

## 2010-11-14 NOTE — Consult Note (Signed)
NEW PATIENT CONSULTATION   [name] [name], [name] [name]  DOB:  1935-01-23                                        May 15, 2010  CHART #:  [id number]   REASON FOR CONSULTATION:  Severe three-vessel coronary artery disease  with abnormal stress test.   CLINICAL HISTORY:  I was asked by Dr. Jacinto Halim to evaluate the patient for  consideration of coronary bypass surgery.  He is a 76 year old white  male smoker with a history of hypertension, diabetes, hyperlipidemia,  and 2 previous strokes secondary to carotid vascular disease, who was  noted to have an abnormal EKG and a new heart murmur by his primary  physician Dr. Aida Puffer.  He was referred to Dr. Jacinto Halim for  evaluation.  He underwent a Cardiolite stress test which showed normal  left ventricular function.  There was mild inferior, inferior apical,  and apical anterior ischemia.  Echocardiogram showed grade 3 diastolic  dysfunction.  There was mild mitral regurgitation and no significant  tricuspid regurgitation.  There was hyperdynamic left ventricular  systolic function.  He also underwent carotid Doppler examination due to  his previous history of carotid disease.  This showed a high-grade right  internal carotid artery stenosis with velocities of 402/101 cm/sec.  His  previous left carotid endarterectomy site was patent.  He also had lower  extremity vascular exam done which showed a markedly reduced right ABI  of 0.46.  He subsequently underwent cardiac catheterization on May 08, 2010, which showed significant three-vessel coronary disease.  The  right coronary artery was a large vessel which was occluded in the  proximal portion with filling of the distal vessel by collaterals from  the LAD.  The left main was a large vessel that had a moderate amount of  calcification and 30-40% distal taper.  The left circumflex had an  ostial 60-70% stenosis followed by a mid 80-90% stenosis with a single  moderate-sized  marginal branch.  The LAD was a large caliber vessel that  gave off a small-to-moderate size first diagonal branch that had 80%  ostial stenosis.  The LAD had about 80-90% mid vessel stenosis at the  origin of the second diagonal branch which had about 80% ostial stenosis  in it.  The right and left internal mammary arteries were identified and  there was no stenosis.  There was about 80-90% right internal carotid  artery stenosis.  Abdominal aortogram was felt to show a high-grade left  renal artery stenosis and moderate right renal artery stenosis.  Lower  extremity arteriogram showed occlusion of the proximal superficial  femoral artery on the right that reconstituted after a short segment  occlusion.  On the left, the superficial femoral artery was patent  without any high-grade luminal stenosis.   REVIEW OF SYSTEMS:  As follows.  GENERAL:  He denies any fever or chills.  He has had no recent weight  changes.  He denies fatigue, although his wife said that he frequently  do things in the morning and then come in for an afternoon nap which may  be 30 minutes to several hours.  EYES:  Negative.  ENT:  Negative.  ENDOCRINE:  He has adult-onset diabetes since age 60.  He denies  hypothyroidism.  CARDIOVASCULAR:  He denies any chest pain or pressure.  He has had no  pain in his neck or jaw.  He denies any pain in his arms.  He does  report exertional dyspnea.  He has had no PND or orthopnea.  RESPIRATORY:  He does have a productive cough.  GI:  He has had no nausea or vomiting.  He denies melena and bright red  blood per rectum.  GU:  He denies dysuria and hematuria.  MUSCULOSKELETAL:  He denies arthralgias and myalgias.  He is status post  left shoulder surgery in the past.  VASCULAR:  He has undergone left carotid endarterectomy x2.  He had 2  strokes prior to his initial surgery.  He does report pain in his right  hip with ambulation of about 50 feet.  He denies any pain in his  thighs  or calves with ambulation.  NEUROLOGICAL:  He denies any dizziness or syncope.  He has had some  residual weakness in his right arm and leg following a stroke, but is  still ambulatory.  He denies any recent visual changes.  He denies any  new focal weakness or numbness.  PSYCHIATRIC:  Negative.  HEMATOLOGICAL:  Negative.   ALLERGIES:  To Demerol and Darvon.   MEDICATIONS:  1. Metformin 500 mg daily.  2. Amlodipine 10 mg daily.  3. Glimepiride 2 mg q.a.m.  4. Atenolol 50 mg b.i.d.  5. Quinapril 60 mg q.p.m.  6. Fish oil 300 mg 2 daily.  7. Omega Q Plus daily.  8. Lantus insulin 66 units q.p.m.  9. Aspirin 325 mg daily.   PAST MEDICAL HISTORY:  Significant for diabetes, hypertension, and  hyperlipidemia.  He suffered 2 left brain strokes with right hemipareses  2 weeks apart in 1998, and subsequently underwent left carotid  endarterectomy in 1998.  He had a redo left carotid endarterectomy in  2003, by Dr. Arbie Cookey.  He is status post prostate cancer with seed  implants by Dr. Aldean Ast in 2006, and has undergone continued followup  for that.  He had left shoulder surgery with fusion in 1980s for  recurrent dislocation.  He has been diagnosed with midline abdominal  wall hernia.  He had surgery on his foot in the past.   SOCIAL HISTORY:  He is retired, lives with his wife.  He remains fairly  active.  He has smoked at least one pack per day for many years, but now  has cut down to about 6-8 cigarettes per day.  He denies alcohol use.  He has 4 sons.   FAMILY HISTORY:  His father died at age 79 with myocardial infarction.  His brother died at age 26 from stroke and myocardial infarction.  His  mother died of stroke at age 54.   PHYSICAL EXAMINATION:  Vital Signs:  His blood pressure is 159/74, pulse  is 70 and regular, and respiratory rate is 16, unlabored.  Oxygen  saturation on room air is 92%.  General:  He is an elderly white male  who grossly looks like he has  COPD.  HEENT:  Normocephalic and  atraumatic.  Pupils are equal and reactive to light and accommodation.  Extraocular muscles are intact.  Oropharynx is clear.  Neck:  Normal  carotid pulses bilaterally.  There are no bruits.  There is no  adenopathy or thyromegaly.  There is a left carotid endarterectomy scar.  Cardiac:  Regular rate and rhythm with a grade 1/6 systolic murmur at  the apex.  Lungs:  Clear.  Abdomen:  Active bowel sounds.  His abdomen  is soft, protuberant, and nontender.  There are no palpable masses or  organomegaly.  There appears to be diastasis recti.  Extremities:  No  peripheral edema.  Pedal pulses are not palpable.  Skin:  Warm and dry.  Neurologic:  Alert and oriented x3.  Motor and Sensory:  Appear grossly  normal, although he is striking his right leg slightly.   IMPRESSION:  The patient has severe three-vessel coronary artery disease  with an abnormal stress test.  I suspect he would have more symptoms if  he was more active, but has somewhat limited ambulation due to his right  hip pain.  I agree that coronary bypass surgery is the best treatment  for his coronary disease.  He has significant chronic obstructive  pulmonary disease on exam and we will refer him to Pulmonary Medicine  preoperatively for evaluation and tune up.  We will obtain pulmonary  function testing.  I will also have him to see Dr. Tawanna Cooler Early to  evaluate his right internal carotid artery stenosis, decide if he needs  concurrent right carotid endarterectomy or whether this can be done at a  later date.  I will plan to see him back after these appointments are  made and we will then schedule his surgery.   Evelene Croon, M.D.  Electronically Signed   BB/MEDQ  D:  05/15/2010  T:  05/16/2010  Job:  [id number]   cc:   Cristy Hilts. Jacinto Halim, MD  Missy Sabins, M.D.

## 2010-11-14 NOTE — Consult Note (Signed)
NEW PATIENT CONSULTATION   [name] [name]  DOB:  01-05-1935                                       05/23/2010  ZOXWR#:[id number]   Patient presents today for evaluation of severe right carotid artery  stenosis.  He is well-known to me from a prior left carotid  endarterectomy in 1998 with a re-do in 2003.  He has been symptom-free  since that time.  He had a recent cardiac workup which revealed critical  coronary disease by cardiac catheterization.  He also underwent right  carotid arteriogram via an innominate injection.  I have reviewed his  films.  He has no right carotid symptoms.   His past history is significant for diabetes, elevated blood pressure.  He does have a prior history of prostate cancer with seed implants.   SOCIAL HISTORY:  He is married with 4 children.  He is retired.  He does  smoke a pack of cigarettes per day.  Does not drink alcohol.   FAMILY HISTORY:  Significant for cardiac disease at a premature age in  his father and brother.   REVIEW OF SYSTEMS:  He is 5 feet 10 inches tall.  He denies weight loss  or gain.  He does have history of stroke and pain in his legs with  walking.  Cardiac is positive for heart murmur and severe coronary  disease, as above.  Pulmonary is for COPD.  Musculoskeletal:  Arthritis  and joint pain.  Review of systems otherwise negative except for HPI.   PHYSICAL EXAMINATION:  A well-developed and well-nourished white male  appearing his stated age of 28 in no acute stress.  Blood pressure is  187/73 in his right arm and 190/76 in his left arm.  Heart rate 76,  respirations 18.  HEENT is normal.  I do not appreciate carotid bruits  bilaterally.  He has a well-healed left carotid incision.  Chest is  clear bilaterally.  Heart:  Regular rate and rhythm without murmur.  He  does have 2+ radial and 2+ femoral pulses bilaterally.  Abdominal exam  reveals obesity and no masses noted.  Musculoskeletal shows no  major  deformities or cyanosis.  Neurologic:  No focal weakness or  paresthesias.  Skin without ulcers or rashes.   I did review his carotid duplex evaluation and carotid arteriogram with  patient and his wife present.  I have recommended right carotid  endarterectomy.  I have discussed the options of staging this before his  coronary artery bypass and also staging it after his coronary bypass  versus combined procedure.  I explained that in our institution  we have  been very successful with combined procedures and have recommended this  to him.  He is to see Dr. Sandrea Hughs preoperatively to ensure that he  is not a prohibitave pulmonary risk and otherwise will be scheduled for  surgery.  I explained that we would do this as schedule permits with Dr.  Laneta Simmers and myself.     Larina Earthly, M.D.  Electronically Signed   TFE/MEDQ  D:  05/23/2010  T:  05/24/2010  Job:  [id number]   cc:   Cristy Hilts. Jacinto Halim, MD  Evelene Croon, M.D.  Charlaine Dalton. Sherene Sires, MD, FCCP  Aida Puffer

## 2010-11-17 NOTE — Discharge Summary (Signed)
[facility]  Patient:    [name] [name], [name] [name] Visit Number: [id number] MRN: [id number]          Service Type: SUR Location: [id number] Attending Physician:  Alyson Locket Dictated by:   Sherrie George, P.A. Admit Date:  10/27/2001 Discharge Date: 10/28/2001   CC:         Lewayne Bunting, M.D. [facility]  Titus Dubin. Alwyn Ren, M.D. [facility]   Discharge Summary  DATE OF BIRTH:  10-14-1934  ADMISSION DIAGNOSES: 1. Recurrent left carotid stenosis, asymptomatic. 2. Hypertension. 3. Adult onset diabetes mellitus, non-insulin dependent. 4. Ongoing tobacco use. 5. Mild coronary disease, first degree block and right bundle branch block. 6. Positive family history of coronary artery disease. 7. History of gastrointestinal bleed/anemia.  DISCHARGE DIAGNOSES: 1. Recurrent left carotid stenosis, asymptomatic. 2. Hypertension. 3. Adult onset diabetes mellitus, non-insulin dependent. 4. Ongoing tobacco use. 5. Mild coronary disease, first degree block and right bundle branch block. 6. Positive family history of coronary artery disease. 7. History of gastrointestinal bleed/anemia.  PROCEDURE:  Redo left carotid endarterectomy with Dacron patch angioplasty October 27, 2001 Dr. Arbie Cookey.  BRIEF HISTORY:  The patient is a 76 year old white male who had previously undergone carotid endarterectomy.  He presented with a recurrent asymptomatic stenosis.  He was evaluated by Dr. Gretta Began, and it was his opinion that the patient should undergo redo carotid endarterectomy.  The The risks and benefits were discussed in detail and informed operative consent was obtained.  HOSPITAL COURSE:  The patient was admitted on the day of surgery and underwent the procedure and was transferred to the recovery room and then to the 3300 unit.  He remained stable overnight and neurologically intact.  The first postoperative morning he was alert, oriented, and neurologically intact.   He was mobilized; and later in the morning, when he was able to eat, drink, walk, and void without difficulty, he was discharged home.   He was scheduled to return in 2 weeks, on Thursday, May 15 at 9:30 a.m. to see Dr. Arbie Cookey.  Follow up with Dr. Andee Lineman, his cardiologist, and Dr. Alwyn Ren, his primary care physician, as instructed.  DISCHARGE ACTIVITY:  Light-to-moderate, no lifting over 10 pounds, no driving, no strenuous activity.  CONDITION ON DISCHARGE:  Improved.  Please note, the H&P is not on the chart, so I do not have any more history to give on the patient. Dictated by:   Sherrie George, P.A. Attending Physician:  Alyson Locket DD:  11/10/01 TD:  11/12/01 Job: [id number]

## 2010-11-17 NOTE — Op Note (Signed)
NAME:  [name] [name], [name] [name]             ACCOUNT NO.:  [id number]   MEDICAL RECORD NO.:  [id number]          PATIENT TYPE:  AMB   LOCATION:  [facility]                         FACILITY:  [facility]   PHYSICIAN:  Rozanna Boer., M.D.DATE OF BIRTH:  1934-09-04   DATE OF PROCEDURE:  04/05/2004  DATE OF DISCHARGE:                                 OPERATIVE REPORT   PREOPERATIVE DIAGNOSIS:  T1A Gleason 3 plus 3 left-sided adenocarcinoma of  the prostate.   POSTOPERATIVE DIAGNOSIS:  T1A Gleason 3 plus 3 left-sided adenocarcinoma of  the prostate.   OPERATION:  Prostate brachytherapy, cystoscopy.   ANESTHESIA:  General.   SURGEONS:  1.  Courtney Paris, M.D.  2.  Maryln Gottron, M.D.   BRIEF HISTORY:  This 76 year old type 2 diabetic was admitted for a prostate  seed implant.  He has a clinical T1A Gleason 3 plus 3 adenocarcinoma of the  prostate, for seed implant.  His PSA was 5.7.  Previous biopsy in June 2002  was negative.  He does have COPD and is a smoker and is a type 2 diabetic.  He enters understanding the risks and complications for brachytherapy at  this time.   The patient was placed on the operative table in the dorsal lithotomy  position after satisfactory induction of general endotracheal anesthesia,  where he was shaved, prepped, and draped in the usual sterile fashion.  The  Foley catheter and rectal tube were inserted.  The C-arm was then brought  into the operative field, and a rectal probe 7.5 MHz was inserted into the  prostate position so that it corresponded to the preplanning films.  Using  the computer guidance, the prostate was outlined as well as the urethra and  the rectum, and the urethra was outside the 150% dose on all the needles.  It seemed like there was adequate and good placement of the needles, so the  treatment was begun.  Traction marking sutures were then passed, and  starting at the base a total of 16 needles of I-125 with 53 seeds were then  implanted using both ultrasonic and fluoroscopic guidance, with good  coverage of the prostate.  At the completion of the case, the Foley balloon  was then removed, and the patient was reprepped and draped, and a flexible  cystoscope was used to inspect the urethra.  No lesions were seen.  The  posterior urethra had no evidence of any seeds, and the retrograde view of  the bladder neck showed no extravasation of any seeds.  The bladder was free  of any lesions or seeds.  The scope was removed, and a #18 Foley catheter  was inserted and left to straight drainage.  The urine was clear.  The  patient was then taken to the recovery area, where he will be reacted from  his anesthetic and later discharged as an outpatient with detailed written  instructions.      HMK/MEDQ  D:  04/05/2004  T:  04/05/2004  Job:  [id number]

## 2010-11-17 NOTE — Op Note (Signed)
NAME:  [name] [name], [name] [name]             ACCOUNT NO.:  [id number]   MEDICAL RECORD NO.:  [id number]          PATIENT TYPE:  AMB   LOCATION:  [facility]                          FACILITY:  [facility]   PHYSICIAN:  Larina Earthly, M.D.    DATE OF BIRTH:  10-23-1934   DATE OF PROCEDURE:  05/02/2005  DATE OF DISCHARGE:                                 OPERATIVE REPORT   PREOPERATIVE DIAGNOSIS:  Right leg claudication.   POSTOPERATIVE DIAGNOSIS:  Right leg claudication.   PROCEDURE:  Aortogram with bilateral lower extremity runoff.   SURGEON:  Larina Earthly, M.D.   ASSISTANT:  Nurse.   ANESTHESIA:  1% lidocaine local.   COMPLICATIONS:  None.   DISPOSITION:  To recovery room, stable.   PROCEDURE IN DETAIL:  The patient was taken to the operating room and placed  in a supine position where the area of the right and left groins was prepped  and draped in the usual sterile fashion.  Using local anesthesia and a  single-wall puncture, the right common femoral artery was entered.  A  guidewire passed up to the level of the suprarenal aorta.  A 5-French sheath  was passed over the guidewire and a pigtail catheter was at the level of the  suprarenal aorta.  AP projection was undertaken and this revealed single  renal arteries that were widely patent bilaterally.  The patient had no  evidence of narrowing in the aortoiliac segments.  Next, the pigtail  catheter was exchanged for an IMA catheter and this was selectively used to  cross over to the left iliac system.  The catheter was positioned down to  the level of the external iliac artery.  Arteriogram was then obtained  through this; this showed some 50% stenosis of the distal external iliac  artery at the common femoral artery junction.  The common femoral was widely  patent, as was the profundus femoris artery and the superficial femoral  artery on the left.  The patient had normal three-vessel runoff on the left.  The IMA catheter was removed and right  leg runoff was obtained via the right  femoral sheath.  This showed widely patent common and external iliac artery.  The superficial femoral artery was disease from its origin and was occluded  at mid-thigh.  The patient had marked profunda collaterals with  reconstitution of the above-knee popliteal artery and a normal at-the-knee  popliteal artery and below-knee popliteal artery.  There was three-vessel  runoff on the right with the posterior tibial being the dominant runoff  vessel.  The patient tolerated the  procedure without immediate complication.  The sheath was removed after  pressure was held.   FINDINGS:  1.  No significant aortoiliac occlusive disease.  2.  Right superficial femoral artery occlusion with reconstitution of above-      knee popliteal artery and three-vessel runoff.  3.  Left superficial femoral artery patency with normal popliteal and three-      vessel runoff on the left.      Larina Earthly, M.D.  Electronically Signed     TFE/MEDQ  D:  05/02/2005  T:  05/02/2005  Job:  [id number]

## 2010-11-17 NOTE — H&P (Signed)
[place]. [facility]  Patient:    [name] [name], [name] [name] Visit Number: [id number] MRN: [id number]          Service Type: DSU Location: [facility] 2899 15 Attending Physician:  Alyson Locket Dictated by:   Dominica Severin, P.A. Admit Date:  10/22/2001   CC:         Lewayne Bunting, M.D. [facility]  Titus Dubin. Alwyn Ren, M.D. [facility]   History and Physical  DATE OF BIRTH:  March 12, 1935  CHIEF COMPLAINT:  Asymptomatic restenosis of the left internal carotid artery.  HISTORY OF PRESENT ILLNESS:  This is a 76 year old Caucasian male with a known history of ECCVOD, status post left carotid endarterectomy in 1998 by Dr. Kristen Loader. Early for symptomatic active left hemispheric symptoms.  The patient has not been having any symptoms currently but there was a bruit that was found and the patient underwent recent Doppler evaluation which indicated restenosis of his left internal carotid artery.  He was scheduled today for an arteriogram on April 23rd, which confirmed the restenosis, and he is now scheduled for a left redo carotid endarterectomy on Monday, October 27, 2001.  PAST MEDICAL HISTORY: 1. Hypertension. 2. ECCVOD. 3. Peripheral vascular disease. 4. Non-insulin-dependent diabetes mellitus. 5. Family history of coronary artery disease. 6. History of GI bleed secondary to polyps and Coumadin was discontinued. 7. History of anemia.  PAST SURGICAL HISTORY: 1. Left carotid endarterectomy in 1998. 2. Left shoulder surgery 20 years ago. 3. Left foot surgery. 4. Right cataract surgery.  FAMILY HISTORY:  Both mother and father have a history of coronary artery disease and mother passed away from a stroke, as well as siblings with coronary artery disease.  SOCIAL HISTORY:  The patient currently smokes approximately 12 cigarettes a day, which is decreased from before; he had been smoking for 30 years. Alcohol:  He has not drank any alcohol since 1998 but prior to that, he drank beer  or whiskey.  Denies any IV drug use.  He is married and has four children.  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  MEDICATIONS: 1. Avandamet 2/500 mg one tablet p.o. b.i.d. 2. Amaryl 4 mg one tablet p.o. q.a.m. 3. Norvasc 10 mg one tablet p.o. q.a.m. 4. Accupril 40 mg one tablet p.o. q.p.m. 5. Wellbutrin 100 mg one tablet p.o. b.i.d. 6. Atenolol 25 mg p.o. q.p.m. 7. Coated aspirin 325 mg daily. 8. Omega-3 fish oil capsule daily.  ALLERGIES AND REACTION:  The patient states he has an intolerance to DEMEROL which causes him to become hot and perspire.  REVIEW OF SYSTEMS:  Generally, the patients wife states that she has noticed some increased fatigue lately, otherwise, denies any increased weight loss for unexplained reason or weight gain.  Denies any fever or chills.  HEENT:  He has a history of cataract, otherwise, denies any headaches, ear trouble an vision changes or amaurosis fugax; any nose or throat problems or dysphagia. PULMONARY:  The patient currently smokes tobacco.  He does describe what is a smokers cough.  Denies any asthma, history of pulmonary embolism, COPD or emphysema.  CARDIOVASCULAR:  The patient has a history of hypertension.  A stress test one year ago was negative.  He is to follow up and have a repeat stress test in approximately three months.  Denies any history of myocardial infarction or any known coronary artery disease.  Denies any history of arrhythmias.  GASTROINTESTINAL:  The patient does have  a history of GI bleed after being on Coumadin for two years, which was supposed to be a six-month period of time.  He was found to have polyps from that -- he does not have any history of cancer -- and Coumadin was discontinued.  Denies any history of peptic ulcer disease.  Denies any digestive or constipation problems. GENITOURINARY, RENAL AND ENDOCRINE:  The patient is a non-insulin diabetic for four years.  Denies any  urinary difficulties or thyroid problems. CIRCULATORY:  The patient denies any bleeding tendencies.  He does say he bruises easily.  Denies any history of leukemia.  He does state he has a history of anemia, although this is in conjunction with his GI bleeds after being on Coumadin.  SKIN:  Within normal limits.  NEUROLOGICAL:  The patient had two strokes in September of 1998, after which he had repair of his carotid.  Denies any recent stroke symptoms, any TIAs or any numbness or tingling or weakness in the extremities.  PHYSICAL EXAMINATION:  VITAL SIGNS:  Blood pressure is 156/80, pulse is 76, respirations are 20, temperature is 97.6.  Weight is 204.  Height is 5 feet 10 inches.  GENERAL:  This is a 76 year old Caucasian male who is lying in bed in no acute distress after his arteriogram.  HEENT:  Head is normocephalic, atraumatic.  Eyes:  PERRLA/EOMI.  NECK:  Supple without JVD or lymphadenopathy or thyromegaly.  Demonstrates a well-healed carotid endarterectomy incision as well as a soft blowing left-sided bruit.  CHEST:  Clear to auscultation bilaterally anteriorly.  HEART:  Regular rate and rhythm, without murmurs, rubs, or gallops.  ABDOMEN:  Soft, nontender, with positive bowel sounds.  There is no hepatosplenomegaly.  It is mildly obese.  EXTREMITIES:  His left shoulder demonstrates a well-healed scar from his previous shoulder surgery, as well as his left foot surgery.   He does have Doppler flow to his posterior tibial arteries bilaterally as well as palpable radial pulses bilaterally.  There is no pedal edema.  NEUROLOGIC:  He is alert and oriented x3.  Cranial nerves II-XII are grossly intact.  LABORATORY AND ACCESSORY DATA:  All pre-arteriogram lab work was within normal limits.  Chest x-ray did not show any active disease.   EKG was normal sinus rhythm with first-degree heart block as well as right bundle branch block.  IMPRESSION: 1. Recurrent left  carotid artery disease. 2. Extracranial cerebrovascular occlusive disease. 3. Hypertension. 4. Peripheral vascular disease. 5. Non-insulin-dependent diabetes mellitus. 6. Positive family history of coronary artery disease. 7. History of gastrointestinal bleed secondary to polyps as well as Coumadin    toxicity. 8. History of anemia.  PLAN:  The patient is to undergo a repair of the left internal carotid artery stenosis by undergoing a redo left carotid endarterectomy on October 27, 2001 by Dr. Arbie Cookey.  Dr. Arbie Cookey has seen and evaluate this patient prior to this admission and he has explained the risks and benefits of the procedure as stated above and the patient has agreed to continue. Dictated by:   Dominica Severin, P.A. Attending Physician:  Alyson Locket DD:  10/22/01 TD:  10/22/01 Job: [id number]

## 2010-11-17 NOTE — Consult Note (Signed)
[place]. [facility]  Patient:    [name] [name], [name] [name] Visit Number: [id number] MRN: [id number]          Service Type: DSU Location: [facility] 2899 15 Attending Physician:  Alyson Locket Dictated by:   Kristine Royal, M.D. Proc. Date: 10/22/01 Admit Date:  10/22/2001   CC:         Caralee Ates, M.D.   Consultation Report  REASON FOR CONSULTATION: Interpretation of intracranial cerebral arteriogram done on October 22, 2001 by Dr. Tawanna Cooler Early and Dr. Brayton Layman.  INTERPRETATION: Right common carotid artery injection results in good opacification of the right internal carotid artery, which is widely patent into the brain. There is no siphon stenosis. The anterior and middle cerebral vessels opacify normally on the right. There is also cross filling to supply the left anterior cerebral artery with some to and fro opacification of the left A1 segment. This suggests hemodynamic compromise on the left. There is no evidence of aneurysm or vascular malformation, although the anterior cerebral vessels were somewhat tortuous.  On the left, the internal carotid artery is opacified via a common carotid injection. There is no siphon stenosis. There is opacification of the left middle cerebral artery territory and a minimal extent, the left anterior cerebral artery territory. The patient has known carotid stenosis in the neck on the left. There is no evidence of aneurysm or vascular malformation. Venous phases are all normal.  IMPRESSION: The intracranial vasculature of the anterior circulation is intrinsically unremarkable. There are some flow dynamics manifest here due to left carotid stenosis in the neck with the left internal carotid injection supplying not only the right middle and anterior cerebral circulation but also to a large extent, the left anterior cerebral circulation. Dictated by:   Kristine Royal, M.D. Attending Physician:  Alyson Locket DD:   10/22/01 TD:  10/22/01 Job: [id number]

## 2010-11-17 NOTE — Op Note (Signed)
NAME:  [name] [name], [name] [name]             ACCOUNT NO.:  [id number]   MEDICAL RECORD NO.:  [id number]          PATIENT TYPE:  AMB   LOCATION:  SDS                          FACILITY:  [facility]   PHYSICIAN:  Larina Earthly, M.D.    DATE OF BIRTH:  1934-11-02   DATE OF PROCEDURE:  05/02/2005  DATE OF DISCHARGE:  05/02/2005                                 OPERATIVE REPORT   PREOPERATIVE DIAGNOSIS:  Right leg claudication.   POSTOP DIAGNOSIS:  Right leg claudication.   PROCEDURE:  Aortogram with bilateral inferior runoff.   SURGEON:  Larina Earthly, M.D.   ANESTHESIA:  1% lidocaine local.   COMPLICATIONS:  None.   DISPOSITION:  To holding area stable.   PROCEDURE IN DETAIL:  The patient was taken to the peripheral vascular cath  lab and placed supine position where the area of both groins were prepped in  sterile fashion.  Using local anesthesia and a single-wall puncture the  right common femoral artery was entered. A guidewire was passed up to the  level of the suprarenal aorta. A 5-French sheath was passed over the  guidewire and a pigtail catheter positioned at the level of suprarenal  aorta. AP projections were undertaken revealing widely patent aortoiliac  system. The patient had a single widely patent renal arteries bilaterally.  Next the pigtail catheter was exchanged for an IMA catheter. This was  positioned at the level of the iliac system on the right. Runoff was  obtained with a bolus chase technique. The patient had a widely patent  superficial femoral and profunda femoris artery on the left.  Popliteal  artery was widely patent and there was normal three-vessel runoff on the  left. The IMA catheter was then removed and right leg runoff was obtained  via the right femoral sheath. This again revealed patent iliac system. There  was a stenosis in the superficial femoral artery just distal to its takeoff.  The profunda femoris was widely patent. There was complete occlusion of the  superficial femoral artery at the adductor canal with reconstitution of the  above-knee popliteal artery. The popliteal artery at the level of the knee  was normal and there was of normal three-vessel runoff on the right with the  posterior tibial being the dominant runoff vessel into the foot. The patient  tolerated procedure without immediate complication was transferred to the  holding area stable condition.   FINDINGS:  1.  Normal aortoiliac segment.  2.  Widely patent renal arteries bilaterally.  3.  Right superficial femoral artery occlusion with reconstitution of the      above-knee popliteal artery and three-vessel runoff.  4.  Widely patent left superficial femoral and profunda femoris artery with      three-vessel runoff event.      Larina Earthly, M.D.  Electronically Signed     TFE/MEDQ  D:  07/16/2005  T:  07/16/2005  Job:  [id number]   cc:   Peripheral vascular cath lab

## 2010-11-17 NOTE — Op Note (Signed)
[place]. [facility]  Patient:    [name] [name], [name] [name] Visit Number: [id number] MRN: [id number]          Service Type: DSU Location: [facility] 2899 15 Attending Physician:  Alyson Locket Dictated by:   Larina Earthly, M.D. Proc. Date: 10/22/01 Admit Date:  10/22/2001   CC:         Lewayne Bunting, M.D.   Operative Report  PREOPERATIVE DIAGNOSIS:  Recurrent left internal carotid artery stenosis.  POSTOPERATIVE DIAGNOSIS:  Recurrent left internal carotid artery stenosis.  OPERATION PERFORMED:  Arch aortic arteriogram and cervical and intracranial carotid arteriogram.  SURGEON:  Larina Earthly, M.D.  ASSISTANT:  Caralee Ates, M.D.  ANESTHESIA:  1% lidocaine local.  COMPLICATIONS:  None.  DISPOSITION:  To holding area stable.  DESCRIPTION OF PROCEDURE:  The patient was taken to the peripheral vascular cath lab and placed in supine position where the area of the right groin was prepped and draped in the usual sterile fashion.  Using local anesthesia and a single wall puncture, the right common femoral artery was entered and the guide wire was passed up the lumen of the aortic arch.  A pigtail catheter was positioned in the aortic arch and RAO oblique views were undertaken.  This revealed widely patent take-off of the great vessels.  There was a bovine arch with the left common carotid artery coming off the innominate near the aortic take off.  There were patent vertebral arteries bilaterally.  Next, using a Simmons catheter, the innominate artery was selectively catheterized and a guide wire was passed up into the right common carotid artery and the catheter was positioned into the right common carotid artery.  AP and lateral cervical and intracranial views were taken at this level.  The intracranial views will be dictated as a separate note by [facility].  There was moderate approximately 50% stenosis of the internal carotid artery above the  carotid bifurcation.  Next, the [facility] catheter was removed and the left common carotid artery was selectively catheterized with an HN1 catheter.  Again AP and lateral cervical and intracranial views were undertaken.  This revealed carotid restenosis from the prior endarterectomy.  There was approximately 75 to 80% stenosis in the common carotid artery at the proximal most portion of the patch and there was 90% stenosis of the internal carotid artery at the carotid bifurcation just prior to the external carotid artery take off.  The internal carotid artery had mild irregularity but no stenosis in the internal carotid artery.  The patient tolerated the procedure without immediate complication.  The catheter was removed and the patient was transferred to the holding area in stable condition.  FINDINGS: 1. Moderate right internal carotid artery stenosis. 2. Severe recurrent stenosis in the left carotid system at the proximal    portion of the patch and also in the midportion of the endarterectomy just    prior to the take off of the internal and external carotid arteries. Dictated by:   Larina Earthly, M.D. Attending Physician:  Alyson Locket DD:  10/22/01 TD:  10/22/01 Job: [id number]

## 2010-11-17 NOTE — Op Note (Signed)
[place]. [facility]  Patient:    [name] [name], [name] [name] Visit Number: [id number] MRN: [id number]          Service Type: SUR Location: 3300 3313 01 Attending Physician:  Alyson Locket Dictated by:   Larina Earthly, M.D. Proc. Date: 10/27/01 Admit Date:  10/27/2001 Discharge Date: 10/28/2001   CC:         Lewayne Bunting, M.D.  Titus Dubin. Alwyn Ren, M.D. [facility]   Operative Report  PREOPERATIVE DIAGNOSIS:  Asymptomatic recurrent left internal carotid artery stenosis.  POSTOPERATIVE DIAGNOSIS:  Asymptomatic recurrent left internal carotid artery stenosis.  OPERATION PERFORMED:  Left carotid redo endarterectomy and Dacron patch angioplasty.  SURGEON:  Larina Earthly, M.D.  ASSISTANT: 1. Di Kindle. Edilia Bo, M.D. 2. Sherrie George, P.A.  ANESTHESIA:  General endotracheal.  COMPLICATIONS:  None.  DISPOSITION:  To recovery room stable.  DESCRIPTION OF PROCEDURE:  The patient was taken to the operating room and placed in supine position where the area of the left neck was prepped and draped in the usual sterile fashion.  Incision was made through the prior endarterectomy incision and carried down to isolate the common carotid artery. This was encircled with an umbilical tape and Rumel tourniquet.  The vagus nerve was identified and preserved.  Dissection extended onto the bifurcation. The superior thyroid artery was encircled with 2-0 silk Potts tie.  The external carotid artery was encircled with the blue vessel loop.  The internal carotid artery was encircled with umbilical tape and Rumel tourniquet.  The patient was given 7000 units of intravenous heparin.  After adequate circulation time, the internal, external and common carotid arteries were occluded.  The common carotid artery was opened with the 11 blade and extended longitudinally with Potts scissors through the prior endarterectomy incision. A 10 shunt was passed up the internal carotid,  allowed to back-bleed, then down the common carotid where it was secured with a Rumel tourniquet. Endarterectomy was carried out on the common carotid artery and the plaque was divided proximally with the Potts scissors.  The endarterectomy was extended onto the bifurcation.  The external carotid artery was endarterectomized with eversion technique.  The internal carotid artery was endarterectomized with open fashion.  Remaining atheromatous debris was removed from the endarterectomy plane.  A Finesse Hemashield Dacron patch was brought into the field and sewn as a patch angioplasty with a running 6-0 Prolene suture. Prior to completion of the anastomosis, the shunt was removed and the usual flushing maneuvers undertaken.  Anastomosis was then completed and the external, followed by the common and finally the internal carotid artery occlusion clamp was removed.  Excellent flow characteristics were noted with hand-held Doppler in the internal and external carotid arteries.  The patient was given 15 mg of protamine to reverse the heparin.  The wound was irrigated with saline, hemostasis obtained with electrocautery.  The wound was closed with similar 3-0 Vicryl sutures reapproximating sternocleidomastoid over the carotid sheath.  Next, the platysma was closed with running 3-0 Vicryl suture and finally the skin was closed with 4-0 subcuticular Vicryl stitch.  Benzoin and Steri-Strips were applied.  The patient was awakened in the operating room neurologically intact and transferred to the recovery room in stable condition. Dictated by:   Larina Earthly, M.D. Attending Physician:  Alyson Locket DD:  10/27/01 TD:  10/27/01 Job: [id number]

## 2011-02-13 ENCOUNTER — Other Ambulatory Visit: Payer: Medicare Other

## 2011-02-15 ENCOUNTER — Other Ambulatory Visit (INDEPENDENT_AMBULATORY_CARE_PROVIDER_SITE_OTHER): Payer: Medicare Other

## 2011-02-15 DIAGNOSIS — Z48812 Encounter for surgical aftercare following surgery on the circulatory system: Secondary | ICD-10-CM

## 2011-02-15 DIAGNOSIS — I6529 Occlusion and stenosis of unspecified carotid artery: Secondary | ICD-10-CM

## 2011-02-23 NOTE — Procedures (Unsigned)
CAROTID DUPLEX EXAM  INDICATION:  Follow up carotid stenosis.  HISTORY: Diabetes:  Yes. Cardiac:  CABG. Hypertension:  Yes. Smoking:  Previous. Previous Surgery:  Right carotid endarterectomy on 07/27/2010; left carotid endarterectomy re-do on 10/22/2001; left carotid endarterectomy in September 1998. CV History:  Asymptomatic. Amaurosis Fugax No, Paresthesias No, Hemiparesis No.                                      RIGHT             LEFT Brachial systolic pressure:         192               208 Brachial Doppler waveforms:         WNL               WNL Vertebral direction of flow:        Antegrade         Antegrade DUPLEX VELOCITIES (cm/sec) CCA peak systolic                   90                81 ECA peak systolic                   161               140 ICA peak systolic                   138               84 ICA end diastolic                   37                21 PLAQUE MORPHOLOGY:                  Heterogenous      Calcified PLAQUE AMOUNT:                      Mild              Mild PLAQUE LOCATION:                    CCA, ICA, ECA     CCA, ICA, ECA  IMPRESSION: 1. Patent right internal carotid artery with history of endarterectomy     and mild hyperplasia present at the distal patch segment. 2. Left internal carotid artery patent with history of endarterectomy. 3. Bilateral common carotid artery disease present. 4. Right external carotid artery stenosis present. 5. Left side is essentially unchanged since the study on 04/19/2005.         ___________________________________________ Larina Earthly, M.D.  SH/MEDQ  D:  02/15/2011  T:  02/15/2011  Job:  [id number]

## 2011-03-26 LAB — COMPREHENSIVE METABOLIC PANEL
Albumin: 3.9
Alkaline Phosphatase: 75
BUN: 19
Calcium: 9.3
Creatinine, Ser: 0.77
Glucose, Bld: 267 — ABNORMAL HIGH
Potassium: 4.4
Total Protein: 6.5

## 2011-03-26 LAB — URINALYSIS, ROUTINE W REFLEX MICROSCOPIC
Bilirubin Urine: NEGATIVE
Glucose, UA: 500 — AB
Ketones, ur: 15 — AB
Leukocytes, UA: NEGATIVE
Nitrite: NEGATIVE
Protein, ur: 100 — AB
Specific Gravity, Urine: 1.019
Urobilinogen, UA: 0.2
pH: 7

## 2011-03-26 LAB — COMPREHENSIVE METABOLIC PANEL WITH GFR
ALT: 22
AST: 18
CO2: 26
Chloride: 99
GFR calc Af Amer: >60
GFR calc non Af Amer: >60
Sodium: 135
Total Bilirubin: 1.1

## 2011-03-26 LAB — SAMPLE TO BLOOD BANK

## 2011-03-26 LAB — URINE CULTURE: Colony Count: >=100000

## 2011-03-26 LAB — CBC
HCT: 47.8
Hemoglobin: 16.5
MCHC: 34.6
MCV: 93.9
Platelets: 226
RBC: 5.09
RDW: 13.3
WBC: 14.1 — ABNORMAL HIGH

## 2011-03-26 LAB — DIFFERENTIAL
Basophils Absolute: 0
Basophils Relative: 0
Eosinophils Absolute: 0
Eosinophils Relative: 0
Lymphocytes Relative: 8 — ABNORMAL LOW
Lymphs Abs: 1.1
Monocytes Absolute: 0.8
Monocytes Relative: 5
Neutro Abs: 12.2 — ABNORMAL HIGH
Neutrophils Relative %: 87 — ABNORMAL HIGH

## 2011-03-26 LAB — PROTIME-INR
INR: 1
Prothrombin Time: 13.1

## 2011-03-26 LAB — URINE MICROSCOPIC-ADD ON

## 2011-03-26 LAB — APTT: aPTT: 32

## 2011-03-26 LAB — TROPONIN I: Troponin I: 0.03

## 2011-03-26 LAB — LIPASE, BLOOD: Lipase: 22

## 2011-08-16 ENCOUNTER — Encounter: Payer: Self-pay | Admitting: Vascular Surgery

## 2011-08-20 ENCOUNTER — Encounter: Payer: Self-pay | Admitting: Vascular Surgery

## 2011-08-21 ENCOUNTER — Ambulatory Visit (INDEPENDENT_AMBULATORY_CARE_PROVIDER_SITE_OTHER): Payer: Medicare Other | Admitting: *Deleted

## 2011-08-21 ENCOUNTER — Ambulatory Visit (INDEPENDENT_AMBULATORY_CARE_PROVIDER_SITE_OTHER): Payer: Medicare Other | Admitting: Vascular Surgery

## 2011-08-21 ENCOUNTER — Encounter: Payer: Self-pay | Admitting: Vascular Surgery

## 2011-08-21 VITALS — BP 159/62 | HR 72 | Resp 16 | Ht 70.0 in | Wt 216.0 lb

## 2011-08-21 DIAGNOSIS — I6529 Occlusion and stenosis of unspecified carotid artery: Secondary | ICD-10-CM | POA: Insufficient documentation

## 2011-08-21 DIAGNOSIS — Z48812 Encounter for surgical aftercare following surgery on the circulatory system: Secondary | ICD-10-CM

## 2011-08-21 DIAGNOSIS — I83893 Varicose veins of bilateral lower extremities with other complications: Secondary | ICD-10-CM

## 2011-08-21 NOTE — Progress Notes (Signed)
The patient presents today for followup of carotid disease. He is status post right carotid endarterectomy in January 2012 status post left carotid endarterectomy in 1998 with a redo left carotid endarterectomy in April of 2003. He has no symptoms related to carotid disease. Specifically he has no amaurosis fugax, transient ischemic attack or stroke. He has been stable from a cardiac standpoint since his coronary bypass a year ago.  Past Medical History  Diagnosis Date  . Diabetes mellitus   . Hypertension   . Hyperlipidemia   . Arthritis   . Peripheral vascular disease   . Hernia of abdominal wall   . Stroke 1998    History of 2 CVA  . DVT (deep venous thrombosis)   . Cancer     prostate    History  Substance Use Topics  . Smoking status: Former Games developer  . Smokeless tobacco: Former Neurosurgeon    Quit date: 06/27/2011  . Alcohol Use: No    Family History  Problem Relation Age of Onset  . Heart disease Father     Heart Disease before age 10  . Heart disease Brother   . Hypertension Mother   . Heart attack Mother     Allergies  Allergen Reactions  . Crestor (Rosuvastatin Calcium) Nausea And Vomiting  . Meperidine Hcl     Passes out  . Propoxyphene Hcl     Severe sweating     Current outpatient prescriptions:aspirin 325 MG tablet, Take 325 mg by mouth daily., Disp: , Rfl: ;  glimepiride (AMARYL) 2 MG tablet, Take 2 mg by mouth daily before breakfast., Disp: , Rfl: ;  insulin aspart (NOVOLOG) 100 UNIT/ML injection, Inject into the skin 3 (three) times daily before meals., Disp: , Rfl: ;  insulin glargine (LANTUS) 100 UNIT/ML injection, Inject into the skin 2 (two) times daily., Disp: , Rfl:  metFORMIN (GLUCOPHAGE) 500 MG tablet, Take 500 mg by mouth daily with breakfast., Disp: , Rfl: ;  metoprolol tartrate (LOPRESSOR) 25 MG tablet, Take 25 mg by mouth 2 (two) times daily., Disp: , Rfl: ;  Multiple Vitamin (MULTIVITAMIN) tablet, Take 1 tablet by mouth daily., Disp: , Rfl: ;   traMADol (ULTRAM) 50 MG tablet, Take 50 mg by mouth every 6 (six) hours as needed., Disp: , Rfl:  rosuvastatin (CRESTOR) 20 MG tablet, Take 20 mg by mouth daily., Disp: , Rfl:   BP 159/62  Pulse 72  Resp 16  Ht 5\' 10"  (1.778 m)  Wt 216 lb (97.977 kg)  BMI 30.99 kg/m2  SpO2 98%  Body mass index is 30.99 kg/(m^2).       Physical exam: Well-developed well-nourished white male no acute distress Neurologically grossly 2+ palpable radial pulses bilaterally Carotid incision sealed bilaterally with no bruits Heart regular rate and rhythm  Vascular lab: Widely patent endarterectomy bilaterally with no evidence of stenosis.  Impression and plan: Stable status post bilateral carotid endarterectomies. The patient will notify should he develop any neurologic deficit. Otherwise we'll see him again in one year with a yearly carotid duplex surveillance

## 2011-08-23 NOTE — Procedures (Unsigned)
CAROTID DUPLEX EXAM  INDICATION:  Followup carotid endarterectomy.  HISTORY: Diabetes:  Yes Cardiac:  CABG Hypertension:  Yes Smoking:  Previous Previous Surgery:  Right carotid endarterectomy on 07/27/2010, left carotid endarterectomy redo on 10/22/2001, left carotid endarterectomy 03/1997. CV History:  Currently asymptomatic. Amaurosis Fugax No, Paresthesias No, Hemiparesis No                                      RIGHT             LEFT Brachial systolic pressure:         185               191 Brachial Doppler waveforms:         Normal            Normal Vertebral direction of flow:        Antegrade         Antegrade DUPLEX VELOCITIES (cm/sec) CCA peak systolic                   73                M = 109, D = 157 ECA peak systolic                   139               83 ICA peak systolic                   87                47 ICA end diastolic                   24                15 PLAQUE MORPHOLOGY:                  Heterogeneous     Calcific PLAQUE AMOUNT:                      Mild              Mild PLAQUE LOCATION:                    CCA, ICA, ECA     CCA, ICA, ECA  IMPRESSION: 1. Patent right carotid endarterectomy site with mild hyperplasia     present at the distal patch. 2. Patent left carotid endarterectomy with no evidence of restenosis     of the internal carotid artery. 3. Left distal common carotid artery stenosis. 4. Right external carotid artery stenosis. 5. Antegrade vertebral arteries bilaterally.  ___________________________________________ Larina Earthly, M.D.  EM/MEDQ  D:  08/21/2011  T:  08/21/2011  Job:  [id number]

## 2011-09-12 ENCOUNTER — Other Ambulatory Visit: Payer: Self-pay | Admitting: *Deleted

## 2011-09-12 DIAGNOSIS — I6529 Occlusion and stenosis of unspecified carotid artery: Secondary | ICD-10-CM

## 2011-12-31 HISTORY — PX: TOOTH EXTRACTION: SUR596

## 2012-08-25 ENCOUNTER — Encounter: Payer: Self-pay | Admitting: Neurosurgery

## 2012-08-26 ENCOUNTER — Ambulatory Visit (INDEPENDENT_AMBULATORY_CARE_PROVIDER_SITE_OTHER): Payer: Medicare Other | Admitting: Neurosurgery

## 2012-08-26 ENCOUNTER — Encounter: Payer: Self-pay | Admitting: Neurosurgery

## 2012-08-26 ENCOUNTER — Other Ambulatory Visit (INDEPENDENT_AMBULATORY_CARE_PROVIDER_SITE_OTHER): Payer: Medicare Other | Admitting: *Deleted

## 2012-08-26 VITALS — BP 145/76 | HR 68 | Resp 16 | Ht 70.5 in | Wt 214.0 lb

## 2012-08-26 DIAGNOSIS — I739 Peripheral vascular disease, unspecified: Secondary | ICD-10-CM

## 2012-08-26 DIAGNOSIS — I6529 Occlusion and stenosis of unspecified carotid artery: Secondary | ICD-10-CM

## 2012-08-26 DIAGNOSIS — Z48812 Encounter for surgical aftercare following surgery on the circulatory system: Secondary | ICD-10-CM

## 2012-08-26 DIAGNOSIS — I63239 Cerebral infarction due to unspecified occlusion or stenosis of unspecified carotid arteries: Secondary | ICD-10-CM

## 2012-08-26 NOTE — Progress Notes (Signed)
[name] Office Note  CC: [name] surveillance Referring Physician: Early  History of Present Illness: 78 year old male patient of Dr. Arbie Cookey status post right CEA in 2012 left redo in 2003. The patient denies any signs or symptoms of CVA, TIA, amaurosis fugax. The patient does have a remote history of CVA as well as cardiac surgery and states that he does have some type of blockage or clot in his right lower extremity that Dr. early has discussed with him in the past. The patient reports no paresthesias however he is diabetic and may have peripheral neuropathy in that extremity.  Past Medical History  Diagnosis Date  . Diabetes mellitus   . Hypertension   . Hyperlipidemia   . Arthritis   . Peripheral vascular disease   . Hernia of abdominal wall   . Stroke 1998    History of 2 CVA  . DVT (deep venous thrombosis)   . Cancer     prostate    ROS: [x]  Positive   [ ]  Denies    General: [ ]  Weight loss, [ ]  Fever, [ ]  chills Neurologic: [ ]  Dizziness, [ ]  Blackouts, [ ]  Seizure [ ]  Stroke, [ ]  "Mini stroke", [ ]  Slurred speech, [ ]  Temporary blindness; [ ]  weakness in arms or legs, [ ]  Hoarseness Cardiac: [ ]  Chest pain/pressure, [ ]  Shortness of breath at rest [ ]  Shortness of breath with exertion, [ ]  Atrial fibrillation or irregular heartbeat Vascular: [ ]  Pain in legs with walking, [ ]  Pain in legs at rest, [ ]  Pain in legs at night,  [ ]  Non-healing ulcer, [ ]  Blood clot in vein/DVT,   Pulmonary: [ ]  Home oxygen, [ ]  Productive cough, [ ]  Coughing up blood, [ ]  Asthma,  [ ]  Wheezing Musculoskeletal:  [ ]  Arthritis, [ ]  Low back pain, [ ]  Joint pain Hematologic: [ ]  Easy Bruising, [ ]  Anemia; [ ]  Hepatitis Gastrointestinal: [ ]  Blood in stool, [ ]  Gastroesophageal Reflux/heartburn, [ ]  Trouble swallowing Urinary: [ ]  chronic Kidney disease, [ ]  on HD - [ ]  MWF or [ ]  TTHS, [ ]  Burning with urination, [ ]  Difficulty urinating Skin: [ ]   Rashes, [ ]  Wounds Psychological: [ ]  Anxiety, [ ]  Depression   Social History History  Substance Use Topics  . Smoking status: Former Games developer  . Smokeless tobacco: Former Neurosurgeon    Quit date: 06/27/2011  . Alcohol Use: No    Family History Family History  Problem Relation Age of Onset  . Heart disease Father     Heart Disease before age 42  . Heart disease Brother   . Hypertension Mother   . Heart attack Mother     Allergies  Allergen Reactions  . Darvon (Propoxyphene Hcl)     Severe sweating  . Crestor (Rosuvastatin Calcium) Nausea And Vomiting  . Meperidine Hcl     Passes out    Current Outpatient Prescriptions  Medication Sig Dispense Refill  . aspirin 325 MG tablet Take 325 mg by mouth daily.      Marland Kitchen glimepiride (AMARYL) 2 MG tablet Take 2 mg by mouth daily before breakfast.      . insulin aspart (NOVOLOG) 100 UNIT/ML injection Inject into the skin 3 (three) times daily before meals.      . insulin glargine (LANTUS) 100 UNIT/ML injection Inject into the skin 2 (two) times daily.      . metFORMIN (GLUCOPHAGE) 500 MG tablet  Take 500 mg by mouth daily with breakfast.      . metoprolol tartrate (LOPRESSOR) 25 MG tablet Take 25 mg by mouth 2 (two) times daily.      . Multiple Vitamin (MULTIVITAMIN) tablet Take 1 tablet by mouth daily.      . rosuvastatin (CRESTOR) 20 MG tablet Take 20 mg by mouth daily.      . traMADol (ULTRAM) 50 MG tablet Take 50 mg by mouth every 6 (six) hours as needed.       No current facility-administered medications for this visit.    Physical Examination  Filed Vitals:   08/26/12 1104  BP: 145/76  Pulse: 68  Resp: 16    Body mass index is 30.26 kg/(m^2).  General:  WDWN in NAD Gait: Normal HEENT: WNL Eyes: Pupils equal Pulmonary: normal non-labored breathing , without Rales, rhonchi,  wheezing Cardiac: RRR, without  Murmurs, rubs or gallops; Abdomen: soft, NT, no masses Skin: no rashes, ulcers noted  Vascular Exam Pulses: 2+  radial pulses bilaterally, palpable femoral pulses bilaterally [name] bruits Olen [name] pulses to auscultation no bruits are heard Extremities without ischemic changes, no Gangrene , no cellulitis; no open wounds;  Musculoskeletal: no muscle wasting or atrophy   Neurologic: A&O X 3; Appropriate Affect ; SENSATION: normal; MOTOR FUNCTION:  moving all extremities equally. Speech is fluent/normal  Non-Invasive Vascular Imaging [name] DUPLEX 08/26/2012  Right ICA 20 - 39 % stenosis Left ICA 20 - 39 % stenosis   ASSESSMENT/PLAN: Asymptomatic patient will followup in one year with repeat [name] duplex, he declines any further diagnoses or interventional his lower extremities at this time but has agreed for ABIs in 3 months and see Dr. Arbie Cookey. The patient's questions were otherwise encouraged and answered, he is in agreement with this plan.  Lauree Chandler ANP   Clinic MD: Early

## 2012-08-26 NOTE — Addendum Note (Signed)
Addended by: Sharee Pimple on: 08/26/2012 12:56 PM   Modules accepted: Orders

## 2012-11-21 ENCOUNTER — Encounter: Payer: Self-pay | Admitting: Vascular Surgery

## 2012-11-25 ENCOUNTER — Encounter: Payer: Self-pay | Admitting: Vascular Surgery

## 2012-11-25 ENCOUNTER — Encounter (INDEPENDENT_AMBULATORY_CARE_PROVIDER_SITE_OTHER): Payer: Medicare Other | Admitting: Vascular Surgery

## 2012-11-25 ENCOUNTER — Ambulatory Visit (INDEPENDENT_AMBULATORY_CARE_PROVIDER_SITE_OTHER): Payer: Medicare Other | Admitting: Vascular Surgery

## 2012-11-25 VITALS — BP 151/59 | HR 70 | Resp 16 | Ht 70.0 in | Wt 216.0 lb

## 2012-11-25 DIAGNOSIS — M79609 Pain in unspecified limb: Secondary | ICD-10-CM | POA: Insufficient documentation

## 2012-11-25 DIAGNOSIS — I739 Peripheral vascular disease, unspecified: Secondary | ICD-10-CM

## 2012-11-25 DIAGNOSIS — I70219 Atherosclerosis of native arteries of extremities with intermittent claudication, unspecified extremity: Secondary | ICD-10-CM | POA: Insufficient documentation

## 2012-11-25 DIAGNOSIS — Z48812 Encounter for surgical aftercare following surgery on the circulatory system: Secondary | ICD-10-CM

## 2012-11-25 NOTE — Addendum Note (Signed)
Addended by: Adria Dill L on: 11/25/2012 03:59 PM   Modules accepted: Orders

## 2012-11-25 NOTE — Progress Notes (Signed)
Patient is a for evaluation of lower extremity discomfort. Well-known to me from prior carotid surgery. He does have known moderate arterial insufficiency. Approximately 2 weeks he had that much increase in his right calf discomfort with cramping and pain. This was quite severe for several days and was returned to his baseline claudication type symptoms. He reports that he walks radial to reverse restore that he has to stop and rest. This is moderately limiting to him. He denies any true resting symptoms such as rest pain and does not have any history of lower extremity tissue loss area he has been stable for cardiac standpoint following coronary bypass grafting. He. He denies any stroke events.  Past Medical History  Diagnosis Date  . Diabetes mellitus   . Hypertension   . Hyperlipidemia   . Arthritis   . Peripheral vascular disease   . Hernia of abdominal wall   . DVT (deep venous thrombosis)   . Cancer     prostate  . Stroke 1998    History of 2 CVA  two weeks apart    History  Substance Use Topics  . Smoking status: Former Games developer  . Smokeless tobacco: Former Neurosurgeon    Quit date: 06/27/2011  . Alcohol Use: No    Family History  Problem Relation Age of Onset  . Heart disease Father     Heart Disease before age 19  . Heart disease Brother   . Heart attack Brother   . Hypertension Mother   . Heart attack Mother     Allergies  Allergen Reactions  . Darvon (Propoxyphene Hcl)     Severe sweating  . Crestor (Rosuvastatin Calcium) Nausea And Vomiting  . Meperidine Hcl     Passes out    Current outpatient prescriptions:aspirin 325 MG tablet, Take 325 mg by mouth daily., Disp: , Rfl: ;  glimepiride (AMARYL) 2 MG tablet, Take 2 mg by mouth daily before breakfast., Disp: , Rfl: ;  insulin aspart (NOVOLOG) 100 UNIT/ML injection, Inject into the skin 3 (three) times daily before meals., Disp: , Rfl: ;  insulin glargine (LANTUS) 100 UNIT/ML injection, Inject into the skin 2 (two) times  daily., Disp: , Rfl:  metFORMIN (GLUCOPHAGE) 500 MG tablet, Take 500 mg by mouth daily with breakfast., Disp: , Rfl: ;  metoprolol tartrate (LOPRESSOR) 25 MG tablet, Take 25 mg by mouth 2 (two) times daily., Disp: , Rfl: ;  Multiple Vitamin (MULTIVITAMIN) tablet, Take 1 tablet by mouth daily., Disp: , Rfl: ;  rosuvastatin (CRESTOR) 20 MG tablet, Take 20 mg by mouth daily., Disp: , Rfl:  traMADol (ULTRAM) 50 MG tablet, Take 50 mg by mouth every 6 (six) hours as needed., Disp: , Rfl:   BP 151/59  Pulse 70  Resp 16  Ht 5\' 10"  (1.778 m)  Wt 216 lb (97.977 kg)  BMI 30.99 kg/m2  SpO2 97%  Body mass index is 30.99 kg/(m^2).       Physical exam well-developed well-nourished gentleman in no acute distress. He has a slight unsteady gait but is otherwise neurologically intact. Carotid arteries show well-healed incisions bilaterally with no bruits Heart regular rate and rhythm Pulse status 2+ radial and 2+ femoral pulses. He has absent popliteal and distal pulses bilaterally Skin without ulcers or rashes and no tissue loss on his feet bilaterally Neurologically grossly intact  Lower extremity arterial studies reveal ankle arm index of 0.58 on the right and 0.35 on the left with monophasic waveforms bilaterally  Impression and plan: Bilateral  calf claudication related to bilateral lower surety arterial insufficiency. This appears to be superficial femoral occlusive disease based on his ankle exam and noninvasive studies. I am unable to explain the acute worsening and then resolution 2 weeks ago. I did explain that this is not currently limb threatening and feel that continued observation is appropriate. I did explain that the next evaluation would be lower extremity arteriography to determine the level of occlusive disease. I. he understands this could progressed quickly and if so will notify us immediately. Otherwise we will see him again in 6 months with repeat lower extremity Doppler followup

## 2013-05-25 ENCOUNTER — Encounter: Payer: Self-pay | Admitting: Vascular Surgery

## 2013-05-26 ENCOUNTER — Inpatient Hospital Stay (HOSPITAL_COMMUNITY): Admission: RE | Admit: 2013-05-26 | Payer: Medicare Other | Source: Ambulatory Visit

## 2013-05-26 ENCOUNTER — Ambulatory Visit: Payer: Medicare Other | Admitting: Vascular Surgery

## 2013-08-25 ENCOUNTER — Other Ambulatory Visit (HOSPITAL_COMMUNITY): Payer: Medicare Other

## 2013-08-25 ENCOUNTER — Ambulatory Visit: Payer: Medicare Other | Admitting: Neurosurgery

## 2013-10-08 ENCOUNTER — Inpatient Hospital Stay (HOSPITAL_COMMUNITY)
Admission: EM | Admit: 2013-10-08 | Discharge: 2013-10-12 | DRG: 638 | Disposition: A | Discharge status: Skilled Nursing Facility | Payer: Medicare Other | Attending: Internal Medicine | Admitting: Internal Medicine

## 2013-10-08 ENCOUNTER — Emergency Department (HOSPITAL_COMMUNITY): Payer: Medicare Other

## 2013-10-08 ENCOUNTER — Encounter (HOSPITAL_COMMUNITY): Payer: Self-pay | Admitting: Emergency Medicine

## 2013-10-08 DIAGNOSIS — Z794 Long term (current) use of insulin: Secondary | ICD-10-CM

## 2013-10-08 DIAGNOSIS — R296 Repeated falls: Secondary | ICD-10-CM

## 2013-10-08 DIAGNOSIS — Z8249 Family history of ischemic heart disease and other diseases of the circulatory system: Secondary | ICD-10-CM

## 2013-10-08 DIAGNOSIS — E46 Unspecified protein-calorie malnutrition: Secondary | ICD-10-CM | POA: Diagnosis present

## 2013-10-08 DIAGNOSIS — E785 Hyperlipidemia, unspecified: Secondary | ICD-10-CM | POA: Diagnosis present

## 2013-10-08 DIAGNOSIS — I739 Peripheral vascular disease, unspecified: Secondary | ICD-10-CM | POA: Diagnosis present

## 2013-10-08 DIAGNOSIS — I2510 Atherosclerotic heart disease of native coronary artery without angina pectoris: Secondary | ICD-10-CM

## 2013-10-08 DIAGNOSIS — Z8673 Personal history of transient ischemic attack (TIA), and cerebral infarction without residual deficits: Secondary | ICD-10-CM

## 2013-10-08 DIAGNOSIS — F17200 Nicotine dependence, unspecified, uncomplicated: Secondary | ICD-10-CM | POA: Diagnosis present

## 2013-10-08 DIAGNOSIS — R739 Hyperglycemia, unspecified: Secondary | ICD-10-CM

## 2013-10-08 DIAGNOSIS — R42 Dizziness and giddiness: Secondary | ICD-10-CM

## 2013-10-08 DIAGNOSIS — Z9181 History of falling: Secondary | ICD-10-CM

## 2013-10-08 DIAGNOSIS — N3289 Other specified disorders of bladder: Secondary | ICD-10-CM | POA: Diagnosis present

## 2013-10-08 DIAGNOSIS — IMO0002 Reserved for concepts with insufficient information to code with codable children: Secondary | ICD-10-CM

## 2013-10-08 DIAGNOSIS — Z720 Tobacco use: Secondary | ICD-10-CM | POA: Diagnosis present

## 2013-10-08 DIAGNOSIS — I10 Essential (primary) hypertension: Secondary | ICD-10-CM

## 2013-10-08 DIAGNOSIS — W19XXXA Unspecified fall, initial encounter: Secondary | ICD-10-CM

## 2013-10-08 DIAGNOSIS — Z951 Presence of aortocoronary bypass graft: Secondary | ICD-10-CM

## 2013-10-08 DIAGNOSIS — Z86718 Personal history of other venous thrombosis and embolism: Secondary | ICD-10-CM

## 2013-10-08 DIAGNOSIS — S20229A Contusion of unspecified back wall of thorax, initial encounter: Secondary | ICD-10-CM | POA: Diagnosis present

## 2013-10-08 DIAGNOSIS — R32 Unspecified urinary incontinence: Secondary | ICD-10-CM | POA: Diagnosis present

## 2013-10-08 DIAGNOSIS — Z79899 Other long term (current) drug therapy: Secondary | ICD-10-CM

## 2013-10-08 DIAGNOSIS — I160 Hypertensive urgency: Secondary | ICD-10-CM

## 2013-10-08 DIAGNOSIS — E1165 Type 2 diabetes mellitus with hyperglycemia: Secondary | ICD-10-CM

## 2013-10-08 DIAGNOSIS — IMO0001 Reserved for inherently not codable concepts without codable children: Principal | ICD-10-CM | POA: Diagnosis present

## 2013-10-08 DIAGNOSIS — E860 Dehydration: Secondary | ICD-10-CM

## 2013-10-08 DIAGNOSIS — R7309 Other abnormal glucose: Secondary | ICD-10-CM

## 2013-10-08 DIAGNOSIS — D696 Thrombocytopenia, unspecified: Secondary | ICD-10-CM | POA: Diagnosis present

## 2013-10-08 DIAGNOSIS — Z8546 Personal history of malignant neoplasm of prostate: Secondary | ICD-10-CM

## 2013-10-08 LAB — BASIC METABOLIC PANEL
BUN: 15 mg/dL (ref 6–23)
CHLORIDE: 92 meq/L — AB (ref 96–112)
CO2: 27 meq/L (ref 19–32)
Calcium: 9.2 mg/dL (ref 8.4–10.5)
Creatinine, Ser: 0.6 mg/dL (ref 0.50–1.35)
GFR calc Af Amer: >90 mL/min (ref >90)
GFR calc non Af Amer: >90 mL/min (ref >90)
GLUCOSE: 603 mg/dL — AB (ref 70–99)
POTASSIUM: 4.6 meq/L (ref 3.7–5.3)
SODIUM: 134 meq/L — AB (ref 137–147)

## 2013-10-08 LAB — URINALYSIS, ROUTINE W REFLEX MICROSCOPIC
Bilirubin Urine: NEGATIVE
Ketones, ur: 40 mg/dL — AB
Leukocytes, UA: NEGATIVE
Nitrite: NEGATIVE
PH: 6.5 (ref 5.0–8.0)
Protein, ur: NEGATIVE mg/dL
SPECIFIC GRAVITY, URINE: 1.027 (ref 1.005–1.030)
Urobilinogen, UA: 0.2 mg/dL (ref 0.0–1.0)

## 2013-10-08 LAB — CBC WITH DIFFERENTIAL/PLATELET
BASOS ABS: 0 10*3/uL (ref 0.0–0.1)
Basophils Relative: 0 % (ref 0–1)
Eosinophils Absolute: 0.1 10*3/uL (ref 0.0–0.7)
Eosinophils Relative: 1 % (ref 0–5)
HCT: 44.3 % (ref 39.0–52.0)
Hemoglobin: 15.1 g/dL (ref 13.0–17.0)
LYMPHS ABS: 1.2 10*3/uL (ref 0.7–4.0)
LYMPHS PCT: 15 % (ref 12–46)
MCH: 31.9 pg (ref 26.0–34.0)
MCHC: 34.1 g/dL (ref 30.0–36.0)
MCV: 93.7 fL (ref 78.0–100.0)
Monocytes Absolute: 0.6 10*3/uL (ref 0.1–1.0)
Monocytes Relative: 7 % (ref 3–12)
NEUTROS ABS: 6.2 10*3/uL (ref 1.7–7.7)
NEUTROS PCT: 77 % (ref 43–77)
PLATELETS: 141 10*3/uL — AB (ref 150–400)
RBC: 4.73 MIL/uL (ref 4.22–5.81)
RDW: 15 % (ref 11.5–15.5)
WBC: 8 10*3/uL (ref 4.0–10.5)

## 2013-10-08 LAB — GLUCOSE, CAPILLARY
GLUCOSE-CAPILLARY: 123 mg/dL — AB (ref 70–99)
Glucose-Capillary: 126 mg/dL — ABNORMAL HIGH (ref 70–99)
Glucose-Capillary: 141 mg/dL — ABNORMAL HIGH (ref 70–99)
Glucose-Capillary: 196 mg/dL — ABNORMAL HIGH (ref 70–99)

## 2013-10-08 LAB — TSH: TSH: 0.87 u[IU]/mL (ref 0.350–4.500)

## 2013-10-08 LAB — CBG MONITORING, ED
GLUCOSE-CAPILLARY: 410 mg/dL — AB (ref 70–99)
GLUCOSE-CAPILLARY: 348 mg/dL — AB (ref 70–99)
Glucose-Capillary: 584 mg/dL (ref 70–99)
Glucose-Capillary: 456 mg/dL — ABNORMAL HIGH (ref 70–99)

## 2013-10-08 LAB — HEMOGLOBIN A1C
HEMOGLOBIN A1C: 14 % — AB (ref <5.7)
Mean Plasma Glucose: 355 mg/dL — ABNORMAL HIGH (ref <117)

## 2013-10-08 LAB — URINE MICROSCOPIC-ADD ON

## 2013-10-08 LAB — MRSA PCR SCREENING: MRSA by PCR: NEGATIVE

## 2013-10-08 MED ORDER — SODIUM CHLORIDE 0.9 % IV BOLUS (SEPSIS)
1000.0000 mL | Freq: Once | INTRAVENOUS | Status: AC
  Administered 2013-10-08: 1000 mL via INTRAVENOUS

## 2013-10-08 MED ORDER — ACETAMINOPHEN 325 MG PO TABS
650.0000 mg | ORAL_TABLET | Freq: Four times a day (QID) | ORAL | Status: DC | PRN
  Administered 2013-10-08: 650 mg via ORAL
  Filled 2013-10-08: qty 2

## 2013-10-08 MED ORDER — SODIUM CHLORIDE 0.9 % IV SOLN: INTRAVENOUS | Status: DC

## 2013-10-08 MED ORDER — ONDANSETRON HCL 4 MG/2ML IJ SOLN: 4.0000 mg | Freq: Four times a day (QID) | INTRAMUSCULAR | Status: DC | PRN

## 2013-10-08 MED ORDER — SODIUM CHLORIDE 0.9 % IV SOLN
1000.0000 mL | INTRAVENOUS | Status: DC
  Administered 2013-10-08: 1000 mL via INTRAVENOUS

## 2013-10-08 MED ORDER — ASPIRIN 325 MG PO TABS
325.0000 mg | ORAL_TABLET | Freq: Every day | ORAL | Status: DC
  Administered 2013-10-08 – 2013-10-12 (×5): 325 mg via ORAL
  Filled 2013-10-08 (×5): qty 1

## 2013-10-08 MED ORDER — HYDROCODONE-ACETAMINOPHEN 5-325 MG PO TABS
1.0000 | ORAL_TABLET | ORAL | Status: DC | PRN
  Administered 2013-10-08 – 2013-10-12 (×10): 1 via ORAL
  Filled 2013-10-08 (×11): qty 1

## 2013-10-08 MED ORDER — SODIUM CHLORIDE 0.9 % IV SOLN
INTRAVENOUS | Status: DC
  Filled 2013-10-08: qty 1

## 2013-10-08 MED ORDER — ONDANSETRON HCL 4 MG PO TABS: 4.0000 mg | ORAL_TABLET | Freq: Four times a day (QID) | ORAL | Status: DC | PRN

## 2013-10-08 MED ORDER — SODIUM CHLORIDE 0.9 % IV SOLN
INTRAVENOUS | Status: DC
  Administered 2013-10-08: 4 [IU]/h via INTRAVENOUS
  Filled 2013-10-08: qty 1

## 2013-10-08 MED ORDER — HYDRALAZINE HCL 20 MG/ML IJ SOLN
10.0000 mg | Freq: Four times a day (QID) | INTRAMUSCULAR | Status: DC | PRN
  Administered 2013-10-10: 10 mg via INTRAVENOUS
  Filled 2013-10-08: qty 0.5

## 2013-10-08 MED ORDER — HEPARIN SODIUM (PORCINE) 5000 UNIT/ML IJ SOLN
5000.0000 [IU] | Freq: Three times a day (TID) | INTRAMUSCULAR | Status: DC
  Administered 2013-10-08 – 2013-10-09 (×2): 5000 [IU] via SUBCUTANEOUS
  Filled 2013-10-08 (×6): qty 1

## 2013-10-08 MED ORDER — CYCLOBENZAPRINE HCL 5 MG PO TABS
5.0000 mg | ORAL_TABLET | Freq: Three times a day (TID) | ORAL | Status: DC | PRN
  Administered 2013-10-08: 5 mg via ORAL
  Filled 2013-10-08: qty 1

## 2013-10-08 MED ORDER — SODIUM CHLORIDE 0.9 % IV SOLN
1000.0000 mL | Freq: Once | INTRAVENOUS | Status: AC
  Administered 2013-10-08: 1000 mL via INTRAVENOUS

## 2013-10-08 MED ORDER — INSULIN REGULAR BOLUS VIA INFUSION
0.0000 [IU] | Freq: Three times a day (TID) | INTRAVENOUS | Status: DC
  Filled 2013-10-08: qty 10

## 2013-10-08 MED ORDER — METOPROLOL TARTRATE 25 MG PO TABS
25.0000 mg | ORAL_TABLET | Freq: Two times a day (BID) | ORAL | Status: DC
  Administered 2013-10-08 – 2013-10-12 (×9): 25 mg via ORAL
  Filled 2013-10-08 (×9): qty 1

## 2013-10-08 MED ORDER — DEXTROSE 50 % IV SOLN: 25.0000 mL | INTRAVENOUS | Status: DC | PRN

## 2013-10-08 MED ORDER — KETOROLAC TROMETHAMINE 30 MG/ML IJ SOLN
30.0000 mg | Freq: Once | INTRAMUSCULAR | Status: AC
  Administered 2013-10-08: 30 mg via INTRAVENOUS
  Filled 2013-10-08: qty 1

## 2013-10-08 MED ORDER — SODIUM CHLORIDE 0.9 % IJ SOLN
3.0000 mL | Freq: Two times a day (BID) | INTRAMUSCULAR | Status: DC
  Administered 2013-10-08: 3 mL via INTRAVENOUS
  Administered 2013-10-09: 10:00:00 via INTRAVENOUS
  Administered 2013-10-10 – 2013-10-12 (×5): 3 mL via INTRAVENOUS

## 2013-10-08 MED ORDER — ACETAMINOPHEN 650 MG RE SUPP: 650.0000 mg | Freq: Four times a day (QID) | RECTAL | Status: DC | PRN

## 2013-10-08 MED ORDER — ALBUTEROL SULFATE (2.5 MG/3ML) 0.083% IN NEBU: 2.5000 mg | INHALATION_SOLUTION | RESPIRATORY_TRACT | Status: DC | PRN

## 2013-10-08 MED ORDER — DEXTROSE-NACL 5-0.45 % IV SOLN
INTRAVENOUS | Status: DC
  Administered 2013-10-08: 22:00:00 via INTRAVENOUS

## 2013-10-08 NOTE — ED Notes (Addendum)
Beola Cord RN attempt to placed IV access. Unsuccessful.
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
## 2013-10-08 NOTE — ED Notes (Signed)
Unsuccessful attempt for IV access by this RN.
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
## 2013-10-08 NOTE — ED Provider Notes (Signed)
Medical screening examination/treatment/procedure(s) were conducted as a shared visit with non-physician practitioner(s) and myself.  I personally evaluated the patient during the encounter. Pt presents w/ report of fall & generalized weakness. Pt very desceveld on exam and living conditions reported as very poor. At time of my exam after IVF pt reports feeling somewhat better, has no specific complaints. Cardipulm exam benign. Plan for admission for hyperglycemia >600, weakness   EKG Interpretation None        Neta Ehlers, MD 10/08/13 2111

## 2013-10-08 NOTE — Progress Notes (Signed)
Utilization Review completed.  Eldwin Volkov RN CM  

## 2013-10-08 NOTE — ED Notes (Addendum)
Pt soiled with stool and urine. Pt full linen changed and cleaned with warm, soap water. Pt has bruise to tailbone area on assessment. Pt reports has not had insulin in 3 days.

## 2013-10-08 NOTE — ED Notes (Signed)
Bed: ZT24 Expected date:  Expected time:  Means of arrival:  Comments: 1 with frequent falls

## 2013-10-08 NOTE — Progress Notes (Signed)
CSW received a call from Rande Lawman with DSS [phone] to complete an APS report.  CSW completed the reports and they will follow up in 24-72 hours.     [name], MSW, [place], 10/08/2013 Evening Clinical Social Worker [phone]

## 2013-10-08 NOTE — Progress Notes (Signed)
Clinical Social Work Department BRIEF PSYCHOSOCIAL ASSESSMENT 10/08/2013  Patient:  [name] [name],[name]     Account Number:  [id number]     Admit date:  10/08/2013  Clinical Social Worker:  Luretha Rued  Date/Time:  10/08/2013 05:30 PM  Referred by:  CSW  Date Referred:  10/08/2013  Other Referral:   Interview type:  Patient Other interview type:   No family at bedside. Medical staff was able to speak with wife for collateral    PSYCHOSOCIAL DATA Living Status:  ALONE Admitted from facility:   Level of care:   Primary support name:  [name] [name] Primary support relationship to patient:  SPOUSE Degree of support available:   limited supports.    CURRENT CONCERNS  Other Concerns:    SOCIAL WORK ASSESSMENT / PLAN CSW met with patent at bedside to complete this assessment. Patient presents as oriented x4, alert, calm, and cooperative.  Patient reports that he has fallen multiple times in the last coupld weeks and has suffered some injuries.  Patient reports that he is cleaning out the home left by his Aunt as a result of her hoarding. Per reports from the patient's wife the information previously reported is inaccurate.   According to medical documentation the patient has been living in his Aunt's home for over 2 years after a separation from his wife.  It was reported that the patient has been incontitent, non-compliant with medications, and possible under nouished.  CSW called APS and waiting a returned called to complete an APS report for patient's safety.   Assessment/plan status:  Psychosocial Support/Ongoing Assessment of Needs Other assessment/ plan:   Information/referral to community resources:   APS    PATIENT'S/FAMILY'S RESPONSE TO PLAN OF CARE: Patient expressed his appreciation for the support of the social work department.     [name], MSW, [place], 10/08/2013 Evening Clinical Social Worker [phone]

## 2013-10-08 NOTE — ED Notes (Addendum)
Per pt request update wife. Wife, Izora Gala, contact information is [phone].   Per wife husband has been living at aunt house since 10/25/2010. Aunt passed away 4 years ago. Wife states husband has been falling for weeks now and noticed him urinating through his pants.

## 2013-10-08 NOTE — ED Provider Notes (Signed)
CSN: [id number]     Arrival date & time 10/08/13  1334 History   First MD Initiated Contact with Patient 10/08/13 1355     Chief Complaint  Patient presents with  . Hyperglycemia  . Hypertension  . Fall     (Consider location/radiation/quality/duration/timing/severity/associated sxs/prior Treatment) HPI Comments: Patient with history of CABG, diabetes on insulin and metformin -- presents after having a fall today. Patient states that he has been falling because he's been generally weak. Patient denies loss of consciousness. Patient states that when he fell this morning he landed on his left face and sustained a laceration. It is unclear who called EMS. Patient states that he is currently staying in an aunt's house, but he lives in [place] with his wife. Per EMS, house was found to have human feces on the floor and urine sitting around in bottles. Patient was unkempt with dry blood and feces on the skin. Patient states that he has been out of his diabetic medications for the past 3 days. He denies fever, URI symptoms, chest pain, shortness of breath, abdominal pain, nausea, vomiting, diarrhea. She has been urinating frequently. No other skin rashes.  The history is provided by the patient and medical records.    Past Medical History  Diagnosis Date  . Diabetes mellitus   . Hypertension   . Hyperlipidemia   . Arthritis   . Peripheral vascular disease   . Hernia of abdominal wall   . DVT (deep venous thrombosis)   . Cancer     prostate  . Stroke 1998    History of 2 CVA  two weeks apart   Past Surgical History  Procedure Laterality Date  . Prostate surgery      SEED implant  . Carotid endarterectomy  07-27-10    Right CEA  . Carotid endarterectomy  2003    Left CEA redo  . Carotid endarterectomy  1998    Left CEA  . Coronary artery bypass graft  2012    CABG x 3 vessel by Dr. Cyndia Bent  . Shoulder fusion surgery  1980's    for recurrent dislocation  . Foot surgery    .  Pr vein bypass graft,aorto-fem-pop  07/27/10  . Tooth extraction  July 2013   Family History  Problem Relation Age of Onset  . Heart disease Father     Heart Disease before age 35  . Heart disease Brother   . Heart attack Brother   . Hypertension Mother   . Heart attack Mother    History  Substance Use Topics  . Smoking status: Former Research scientist (life sciences)  . Smokeless tobacco: Former Systems developer    Quit date: 06/27/2011  . Alcohol Use: No    Review of Systems  Constitutional: Negative for fever and fatigue.  HENT: Negative for rhinorrhea, sore throat and tinnitus.   Eyes: Negative for photophobia, pain, redness and visual disturbance.  Respiratory: Negative for cough and shortness of breath.   Cardiovascular: Negative for chest pain.  Gastrointestinal: Negative for nausea, vomiting, abdominal pain and diarrhea.  Genitourinary: Negative for dysuria.  Musculoskeletal: Positive for back pain (tailbone). Negative for gait problem, myalgias and neck pain.  Skin: Positive for wound. Negative for rash.  Neurological: Positive for weakness. Negative for dizziness, light-headedness, numbness and headaches.  Psychiatric/Behavioral: Negative for confusion and decreased concentration.    Allergies  Darvon; Crestor; and Meperidine hcl  Home Medications   Current Outpatient Rx  Name  Route  Sig  Dispense  Refill  .  aspirin 325 MG tablet   Oral   Take 325 mg by mouth daily.         Marland Kitchen glimepiride (AMARYL) 2 MG tablet   Oral   Take 2 mg by mouth daily before breakfast.         . insulin aspart (NOVOLOG) 100 UNIT/ML injection   Subcutaneous   Inject into the skin 3 (three) times daily before meals.         . insulin glargine (LANTUS) 100 UNIT/ML injection   Subcutaneous   Inject into the skin 2 (two) times daily.         . metFORMIN (GLUCOPHAGE) 500 MG tablet   Oral   Take 500 mg by mouth daily with breakfast.         . metoprolol tartrate (LOPRESSOR) 25 MG tablet   Oral   Take 25 mg  by mouth 2 (two) times daily.         . Multiple Vitamin (MULTIVITAMIN) tablet   Oral   Take 1 tablet by mouth daily.         . rosuvastatin (CRESTOR) 20 MG tablet   Oral   Take 20 mg by mouth daily.         . traMADol (ULTRAM) 50 MG tablet   Oral   Take 50 mg by mouth every 6 (six) hours as needed.          BP 193/85  Pulse 83  Temp(Src) 97.9 F (36.6 C) (Oral)  Resp 20  SpO2 96%   Physical Exam  Nursing note and vitals reviewed. Constitutional: He is oriented to person, place, and time. He appears well-developed and well-nourished.  HENT:  Head: Normocephalic. Head is without raccoon's eyes and without Battle's sign.  Right Ear: Tympanic membrane, external ear and ear canal normal. No hemotympanum.  Left Ear: Tympanic membrane, external ear and ear canal normal. No hemotympanum.  Nose: Nose normal. No nasal septal hematoma.  Mouth/Throat: Oropharynx is clear and moist.  Eyes: Conjunctivae, EOM and lids are normal. Pupils are equal, round, and reactive to light.  No visible hyphema  Neck: Normal range of motion. Neck supple.  Cardiovascular: Normal rate and regular rhythm.   Pulmonary/Chest: Effort normal and breath sounds normal.  Abdominal: Soft. There is no tenderness.  Musculoskeletal: Normal range of motion.       Cervical back: He exhibits normal range of motion, no tenderness and no bony tenderness.       Thoracic back: He exhibits no tenderness and no bony tenderness.       Lumbar back: He exhibits no tenderness and no bony tenderness.  Neurological: He is alert and oriented to person, place, and time. He has normal strength and normal reflexes. No cranial nerve deficit or sensory deficit. Coordination normal. GCS eye subscore is 4. GCS verbal subscore is 5. GCS motor subscore is 6.  Skin: Skin is warm and dry.  Psychiatric: He has a normal mood and affect.    ED Course  Procedures (including critical care time) Labs Review Labs Reviewed  CBC WITH  DIFFERENTIAL - Abnormal; Notable for the following:    Platelets 141 (*)    All other components within normal limits  BASIC METABOLIC PANEL - Abnormal; Notable for the following:    Sodium 134 (*)    Chloride 92 (*)    Glucose, Bld 603 (*)    All other components within normal limits  URINALYSIS, ROUTINE W REFLEX MICROSCOPIC - Abnormal; Notable for the  following:    Glucose, UA >1000 (*)    Hgb urine dipstick TRACE (*)    Ketones, ur 40 (*)    All other components within normal limits  CBG MONITORING, ED - Abnormal; Notable for the following:    Glucose-Capillary 584 (*)    All other components within normal limits  CBG MONITORING, ED - Abnormal; Notable for the following:    Glucose-Capillary 456 (*)    All other components within normal limits  URINE MICROSCOPIC-ADD ON   Imaging Review Ct Head Wo Contrast  10/08/2013   CLINICAL DATA:  Multiple falls over the last 2-3 weeks.  EXAM: CT HEAD WITHOUT CONTRAST  TECHNIQUE: Contiguous axial images were obtained from the base of the skull through the vertex without intravenous contrast.  COMPARISON:  None.  FINDINGS: There is no evidence of mass effect, midline shift, or extra-axial fluid collections. There is no evidence of a space-occupying lesion or intracranial hemorrhage. There is no evidence of a cortical-based area of acute infarction. There is an old right basal ganglia lacunar infarct. There is mineralization of the right putaminal. There is generalized cerebral atrophy. There is periventricular white matter low attenuation likely secondary to microangiopathy.  The ventricles and sulci are appropriate for the patient's age. The basal cisterns are patent.  Visualized portions of the orbits are unremarkable. The visualized portions of the paranasal sinuses and mastoid air cells are unremarkable. Cerebrovascular atherosclerotic calcifications are noted.  The osseous structures are unremarkable.  IMPRESSION: No acute intracranial pathology.    Electronically Signed   By: Kathreen Devoid   On: 10/08/2013 14:51    2:06 PM Patient seen and examined. Work-up initiated. Medications ordered.   Vital signs reviewed and are as follows: Filed Vitals:   10/08/13 1408  BP: 184/81  Pulse: 85  Temp: 97.9 F (36.6 C)  Resp: 16    Date: 10/08/2013  Rate: 88  Rhythm: normal sinus rhythm  QRS Axis: normal  Intervals: PR prolonged  ST/T Wave abnormalities: nonspecific ST/T changes  Conduction Disutrbances:nonspecific intraventricular conduction delay  Narrative Interpretation:   Old EKG Reviewed: none available   MDM   Final diagnoses:  Hyperglycemia  Fall  Dizziness  Hypertensive urgency   Admit for further eval of above problems. Patient is not safe to go home at this point due to fall risk. He will need to see social Development worker, community to help ensure safe discharge.     Carlisle Cater, PA-C 10/08/13 [phone]

## 2013-10-09 ENCOUNTER — Inpatient Hospital Stay (HOSPITAL_COMMUNITY): Payer: Medicare Other

## 2013-10-09 DIAGNOSIS — E1165 Type 2 diabetes mellitus with hyperglycemia: Principal | ICD-10-CM

## 2013-10-09 DIAGNOSIS — IMO0001 Reserved for inherently not codable concepts without codable children: Principal | ICD-10-CM

## 2013-10-09 DIAGNOSIS — E860 Dehydration: Secondary | ICD-10-CM

## 2013-10-09 LAB — APTT: aPTT: 24 seconds (ref 24–37)

## 2013-10-09 LAB — GLUCOSE, CAPILLARY
GLUCOSE-CAPILLARY: 142 mg/dL — AB (ref 70–99)
GLUCOSE-CAPILLARY: 164 mg/dL — AB (ref 70–99)
GLUCOSE-CAPILLARY: 213 mg/dL — AB (ref 70–99)
GLUCOSE-CAPILLARY: 289 mg/dL — AB (ref 70–99)
Glucose-Capillary: 151 mg/dL — ABNORMAL HIGH (ref 70–99)
Glucose-Capillary: 156 mg/dL — ABNORMAL HIGH (ref 70–99)
Glucose-Capillary: 183 mg/dL — ABNORMAL HIGH (ref 70–99)
Glucose-Capillary: 233 mg/dL — ABNORMAL HIGH (ref 70–99)
Glucose-Capillary: 307 mg/dL — ABNORMAL HIGH (ref 70–99)

## 2013-10-09 LAB — COMPREHENSIVE METABOLIC PANEL
ALT: 9 U/L (ref 0–53)
AST: 13 U/L (ref 0–37)
Albumin: 2.6 g/dL — ABNORMAL LOW (ref 3.5–5.2)
Alkaline Phosphatase: 89 U/L (ref 39–117)
BILIRUBIN TOTAL: 0.6 mg/dL (ref 0.3–1.2)
BUN: 12 mg/dL (ref 6–23)
CO2: 26 meq/L (ref 19–32)
CREATININE: 0.53 mg/dL (ref 0.50–1.35)
Calcium: 8.1 mg/dL — ABNORMAL LOW (ref 8.4–10.5)
Chloride: 101 mEq/L (ref 96–112)
GLUCOSE: 177 mg/dL — AB (ref 70–99)
Potassium: 3.5 mEq/L — ABNORMAL LOW (ref 3.7–5.3)
Sodium: 138 mEq/L (ref 137–147)
Total Protein: 4.7 g/dL — ABNORMAL LOW (ref 6.0–8.3)

## 2013-10-09 LAB — MAGNESIUM: MAGNESIUM: 1.6 mg/dL (ref 1.5–2.5)

## 2013-10-09 LAB — CBC
HCT: 38 % — ABNORMAL LOW (ref 39.0–52.0)
HEMOGLOBIN: 13.4 g/dL (ref 13.0–17.0)
MCH: 32.2 pg (ref 26.0–34.0)
MCHC: 35.3 g/dL (ref 30.0–36.0)
MCV: 91.3 fL (ref 78.0–100.0)
PLATELETS: 87 10*3/uL — AB (ref 150–400)
RBC: 4.16 MIL/uL — AB (ref 4.22–5.81)
RDW: 15.2 % (ref 11.5–15.5)
WBC: 7 10*3/uL (ref 4.0–10.5)

## 2013-10-09 LAB — PROTIME-INR
INR: 1.01 (ref 0.00–1.49)
PROTHROMBIN TIME: 13.1 s (ref 11.6–15.2)

## 2013-10-09 LAB — VITAMIN B12: Vitamin B-12: 833 pg/mL (ref 211–911)

## 2013-10-09 LAB — HIV ANTIBODY (ROUTINE TESTING W REFLEX): HIV 1&2 Ab, 4th Generation: NONREACTIVE

## 2013-10-09 LAB — RPR

## 2013-10-09 MED ORDER — POTASSIUM CHLORIDE CRYS ER 20 MEQ PO TBCR
40.0000 meq | EXTENDED_RELEASE_TABLET | Freq: Once | ORAL | Status: AC
Start: 2013-10-09 — End: 2013-10-09
  Administered 2013-10-09: 40 meq via ORAL
  Filled 2013-10-09: qty 2

## 2013-10-09 MED ORDER — INSULIN GLARGINE 100 UNIT/ML ~~LOC~~ SOLN: 10.0000 [IU] | Freq: Every day | SUBCUTANEOUS | Status: DC

## 2013-10-09 MED ORDER — INSULIN ASPART 100 UNIT/ML ~~LOC~~ SOLN
0.0000 [IU] | Freq: Three times a day (TID) | SUBCUTANEOUS | Status: DC
  Administered 2013-10-09: 5 [IU] via SUBCUTANEOUS
  Administered 2013-10-09: 8 [IU] via SUBCUTANEOUS
  Administered 2013-10-09 – 2013-10-10 (×2): 11 [IU] via SUBCUTANEOUS
  Administered 2013-10-10: 5 [IU] via SUBCUTANEOUS
  Administered 2013-10-10 – 2013-10-11 (×3): 11 [IU] via SUBCUTANEOUS
  Administered 2013-10-11: 8 [IU] via SUBCUTANEOUS
  Administered 2013-10-12: 11 [IU] via SUBCUTANEOUS
  Administered 2013-10-12: 8 [IU] via SUBCUTANEOUS

## 2013-10-09 MED ORDER — BOOST PLUS PO LIQD
237.0000 mL | Freq: Three times a day (TID) | ORAL | Status: DC
  Administered 2013-10-09 – 2013-10-12 (×10): 237 mL via ORAL
  Filled 2013-10-09 (×11): qty 237

## 2013-10-09 MED ORDER — INSULIN GLARGINE 100 UNIT/ML ~~LOC~~ SOLN
10.0000 [IU] | Freq: Two times a day (BID) | SUBCUTANEOUS | Status: DC
  Administered 2013-10-09 – 2013-10-10 (×4): 10 [IU] via SUBCUTANEOUS
  Filled 2013-10-09 (×6): qty 0.1

## 2013-10-09 MED ORDER — SODIUM CHLORIDE 0.9 % IV SOLN
INTRAVENOUS | Status: DC
Start: 2013-10-09 — End: 2013-10-10
  Administered 2013-10-09 – 2013-10-10 (×3): via INTRAVENOUS

## 2013-10-09 NOTE — Progress Notes (Addendum)
Clinical Social Work Department CLINICAL SOCIAL WORK PLACEMENT NOTE 10/09/2013  Patient:  [name] [name],[name] [name]  Account Number:  [id number] Admit date:  10/08/2013  Clinical Social Worker:  Ulyess Blossom  Date/time:  10/09/2013 04:28 PM  Clinical Social Work is seeking post-discharge placement for this patient at the following level of care:   SKILLED NURSING   (*CSW will update this form in Epic as items are completed)   10/09/2013  Patient/family provided with [facility] Department of Clinical Social Work's list of facilities offering this level of care within the geographic area requested by the patient (or if unable, by the patient's family).  10/09/2013  Patient/family informed of their freedom to choose among providers that offer the needed level of care, that participate in Medicare, Medicaid or managed care program needed by the patient, have an available bed and are willing to accept the patient.  10/09/2013  Patient/family informed of [facility]' ownership interest in [facility], as well as of the fact that they are under no obligation to receive care at this facility.  PASARR submitted to EDS on 10/09/2013 PASARR number received from EDS on 10/09/2013  FL2 transmitted to all facilities in geographic area requested by pt/family on  10/09/2013 FL2 transmitted to all facilities within larger geographic area on   Patient informed that his/her managed care company has contracts with or will negotiate with  certain facilities, including the following:     Patient/family informed of bed offers received:  10/12/13 Patient chooses bed at [facility] Physician recommends and patient chooses bed at    Patient to be transferred to [facility] on  10/12/13 Patient to be transferred to facility by [facility]  The following physician request were entered in Epic:   Additional Comments:   Alison Murray, MSW, [place] Work [phone]

## 2013-10-09 NOTE — Evaluation (Signed)
Occupational Therapy Evaluation Patient Details Name: [name] [name] MRN: [id number] DOB: October 04, 1934 Today's Date: 10/09/2013    History of Present Illness         Per chart [name] [name] is a 79 y.o. male with a past medical history of coronary artery disease, status post CABG in 2012, diabetes, hypertension, who was brought in by EMS as the patient had been falling a lot. He has been weak as well over the last couple of weeks. . When EMS got to his house he was found to be lying him in human feces. There was urine sitting around in bottles. Patient was unkempt with dry blood and feces on the skin   Clinical Impression   Pt was admitted for the above.  While, pt reports he was independent with adls and driving, per H & P, he was weak and not caring for himself with good hygiene.  Pt will benefit from skilled OT to increase safety and independence with adls. Goals in acute are for supervision level.  Currently, he is min A overall.      Follow Up Recommendations  SNF (unless he has 24/7 assistance)    Equipment Recommendations   (to be further assessed, possibly 3:1)    Recommendations for Other Services       Precautions / Restrictions Precautions Precautions: Fall Restrictions Weight Bearing Restrictions: No      Mobility Bed Mobility Overal bed mobility: Needs Assistance Bed Mobility: Rolling;Supine to Sit Rolling: Min assist   Supine to sit: Min assist;HOB elevated     General bed mobility comments: use of rail to  push self upright.   Transfers Overall transfer level: Needs assistance Equipment used: Rolling walker (2 wheeled) Transfers: Sit to/from Stand Sit to Stand: Min assist         General transfer comment: from recliner    Balance Overall balance assessment: Needs assistance Sitting-balance support: No upper extremity supported;Feet supported Sitting balance-Leahy Scale: Fair     Standing balance support: Bilateral upper extremity  supported;During functional activity Standing balance-Leahy Scale: Poor                              ADL Overall ADL's : Needs assistance/impaired                                       General ADL Comments: Pt is able to perform grooming and UB bathing with set up and min A for UB dressing due to lines.  LB adls are min A for balance and safety. Only performed sit to stand this session.  Pt was up in chair and wanted to remain sitting     Vision                     Perception     Praxis      Pertinent Vitals/Pain No c/o pain     Hand Dominance     Extremity/Trunk Assessment Upper Extremity Assessment Upper Extremity Assessment: Overall WFL for tasks assessed          Communication Communication Communication: No difficulties   Cognition Arousal/Alertness: Awake/alert Behavior During Therapy: WFL for tasks assessed/performed Overall Cognitive Status: Within Functional Limits for tasks assessed                     General Comments  Exercises       Shoulder Instructions      Home Living Family/patient expects to be discharged to:: Unsure Living Arrangements: Alone                               Additional Comments: pt states he is cleaning out aunt's house (recently passed). He states his Plan is to go back to [facility] with wife.  5 ste with rails high commode; walk in shower and he sometimes uses a cane  Per chart, he is estranged from wife      Prior Functioning/Environment Level of Independence: Independent per pt, but per chart, he was found to not be taking care of himself            OT Diagnosis: Generalized weakness   OT Problem List: Decreased strength;Impaired balance (sitting and/or standing);Decreased activity tolerance;Decreased knowledge of use of DME or AE   OT Treatment/Interventions: Self-care/ADL training;DME and/or AE instruction;Balance training;Patient/family  education    OT Goals(Current goals can be found in the care plan section) Acute Rehab OT Goals Patient Stated Goal: wants to get back to being independent.  states he will go to his home in [place] OT Goal Formulation: With patient Time For Goal Achievement: 10/23/13 Potential to Achieve Goals: Good ADL Goals Pt Will Perform Grooming: with supervision;standing Pt Will Perform Lower Body Bathing: with supervision;sit to/from stand Pt Will Perform Lower Body Dressing: with supervision;sit to/from stand Pt Will Transfer to Toilet: with supervision;ambulating;regular height toilet;bedside commode Pt Will Perform Toileting - Clothing Manipulation and hygiene: with supervision;sit to/from stand  OT Frequency: Min 2X/week   Barriers to D/C:            Co-evaluation              End of Session    Activity Tolerance: Patient tolerated treatment well Patient left: in chair;with call bell/phone within reach;with chair alarm set   Time: 2778-2423 OT Time Calculation (min): 13 min Charges:  OT General Charges $OT Visit: 1 Procedure OT Evaluation $Initial OT Evaluation Tier I: 1 Procedure OT Treatments $Therapeutic Activity: 8-22 mins G-Codes:    Lesle Chris 2013-10-18, 3:11 PM  Lesle Chris, OTR/L [phone] 2013-10-18

## 2013-10-09 NOTE — Evaluation (Signed)
Physical Therapy Evaluation Patient Details Name: [name] [name] MRN: [id number] DOB: 03-19-1935 Today's Date: 10/09/2013   History of Present Illness  [name] [name] is a 79 y.o. male with a past medical history of coronary artery disease, status post CABG in 2012, diabetes, hypertension, who was brought in by EMS as the patient had been falling a lot. He has been weak as well over the last couple of weeks. . When EMS got to his house he was found to be lying him in human feces. There was urine sitting around in bottles. Patient was unkempt with dry blood and feces on the skin  Clinical Impression  Pt very unsteady when up ambulating in room with RW. Pt will benefit from PT to address problems listed below.     Follow Up Recommendations SNF;Supervision/Assistance - 24 hour    Equipment Recommendations  None recommended by PT    Recommendations for Other Services       Precautions / Restrictions Precautions Precautions: Fall      Mobility  Bed Mobility Overal bed mobility: Needs Assistance Bed Mobility: Rolling;Supine to Sit Rolling: Min assist   Supine to sit: Min assist;HOB elevated     General bed mobility comments: use of rail to  push self upright.   Transfers Overall transfer level: Needs assistance Equipment used: Rolling walker (2 wheeled) Transfers: Sit to/from Stand Sit to Stand: Mod assist         General transfer comment: cues for safety an use of RW, assist to rise and steady balance once standing.  Ambulation/Gait Ambulation/Gait assistance: Mod assist Ambulation Distance (Feet): 20 Feet Assistive device: Rolling walker (2 wheeled) Gait Pattern/deviations: Step-through pattern;Decreased stride length;Ataxic;Shuffle;Staggering left;Staggering right     General Gait Details: cues for safety, steady assist for balance, noted forefoot of R  does not flatten to floor, upgoing toes.  Stairs            Wheelchair Mobility    Modified Rankin  (Stroke Patients Only)       Balance Overall balance assessment: Needs assistance Sitting-balance support: No upper extremity supported;Feet supported Sitting balance-Leahy Scale: Fair     Standing balance support: Bilateral upper extremity supported;During functional activity Standing balance-Leahy Scale: Poor                               Pertinent Vitals/Pain VSS    Home Living Family/patient expects to be discharged to:: Unsure Living Arrangements: Alone               Additional Comments: "estranged" from wife    Prior Function Level of Independence: Independent               Hand Dominance        Extremity/Trunk Assessment   Upper Extremity Assessment: Defer to OT evaluation           Lower Extremity Assessment: Generalized weakness;RLE deficits/detail;LLE deficits/detail RLE Deficits / Details: soles of foot red LLE Deficits / Details: similar to R     Communication   Communication: No difficulties  Cognition Arousal/Alertness: Awake/alert Behavior During Therapy: WFL for tasks assessed/performed Overall Cognitive Status: Within Functional Limits for tasks assessed                      General Comments      Exercises        Assessment/Plan    PT Assessment Patient needs continued PT services  PT Diagnosis Difficulty walking;Generalized weakness   PT Problem List Decreased strength;Decreased activity tolerance;Decreased balance;Decreased mobility;Decreased knowledge of precautions;Decreased safety awareness;Decreased knowledge of use of DME;Decreased skin integrity  PT Treatment Interventions DME instruction;Gait training;Functional mobility training;Therapeutic activities;Therapeutic exercise;Patient/family education   PT Goals (Current goals can be found in the Care Plan section) Acute Rehab PT Goals Patient Stated Goal: I will get up for a while. I guess I will go to my other house PT Goal Formulation: With  patient Time For Goal Achievement: 10/23/13 Potential to Achieve Goals: Good    Frequency Min 3X/week   Barriers to discharge Decreased caregiver support      Co-evaluation               End of Session Equipment Utilized During Treatment: Gait belt Activity Tolerance: Patient tolerated treatment well Patient left: in chair;with chair alarm set Nurse Communication: Mobility status         Time: 1005-1037 PT Time Calculation (min): 32 min   Charges:   PT Evaluation $Initial PT Evaluation Tier I: 1 Procedure PT Treatments $Gait Training: 23-37 mins   PT G Codes:          Claretha Cooper 10/09/2013, 2:13 PM Tresa Endo PT [phone]

## 2013-10-09 NOTE — Progress Notes (Addendum)
INITIAL NUTRITION ASSESSMENT  DOCUMENTATION CODES Per approved criteria  Severe Malnutrition related to Chronic Illness.   INTERVENTION:  Boost tid  Encouraged intake of meals  RD to follow.  NUTRITION DIAGNOSIS: Severe Malnutrition related to chronic illness as evidenced by weight loss of 22% in <11 months, intake </=75% for > 1 month, loss of muscle mass and body fat.  Goal: Patient to meet >90% estimated needs with meals and supplements.  Monitor:  Intake, labs, weight trend  Reason for Assessment: Consult for question of malnutrition  79 y.o. male  Admitting Dx: Hyperglycemia  ASSESSMENT: Patient admitted with hyperglycemia.  He had been out of his medicine for the past 2 weeks.  Frequent falls with urinary and fecal incontinence.  Dehydration.  Recently separated from his wife and living at his aunt's house.    4/10-   Patient reports good appetite and intake currently.  Poor appetite for the past week and poor intake for the past 2-3 weeks.  Patient is a poor historian.  Recently seperated from his wife.  States that he was living in his Aunt's house to clean it up to sell it.  Reports that there was food to eat there but he does not cook.  Had a car but did not feel safe driving.  Increased falls over the past 2 weeks.  Noted that patient was found in feces by EMS and bottles of urine were around.  Nurse reports skin is not broken but is deeply bruised.  Patient with a 22% weight loss in the past 11 months.  Uncontrolled DM noted.  Patient meets criteria for severe malnutrition related to chronic illness AEB weight loss of 22% in <11 months, Intake </= 75% for > 1 month, decreased body fat and muscle mass.  Nutrition Focused Physical Exam:  Subcutaneous Fat:  Orbital Region: mild Upper Arm Region: mild-moderate Thoracic and Lumbar Region: normal  Muscle:  Temple Region: moderate Clavicle Bone Region: moderate Clavicle and Acromion Bone Region: severe Scapular  Bone Region: severe Dorsal Hand: n/a Patellar Region: moderate Anterior Thigh Region: severe Posterior Calf Region: severe  Edema: not noted   Height: Ht Readings from Last 1 Encounters:  10/08/13 5\' 10"  (1.778 m)    Weight: Wt Readings from Last 1 Encounters:  10/08/13 168 lb 1.6 oz (76.25 kg)    Ideal Body Weight: 166 lbs  % Ideal Body Weight: 99  Wt Readings from Last 10 Encounters:  10/08/13 168 lb 1.6 oz (76.25 kg)  11/25/12 216 lb (97.977 kg)  08/26/12 214 lb (97.07 kg)  08/21/11 216 lb (97.977 kg)  05/26/10 211 lb 8 oz (95.936 kg)    Usual Body Weight: 216 lbs 11 months ago and consistent for at least the last 3 years prior to this.  % Usual Body Weight: 78  BMI:  Body mass index is 24.12 kg/(m^2).  Estimated Nutritional Needs: Kcal: 1800-1900 Protein: 90-100 gms Fluid: 1.9-2L daily  Skin: bruised but intact Diet Order: Carb Control  EDUCATION NEEDS: -No education needs identified at this time   Intake/Output Summary (Last 24 hours) at 10/09/13 0919 Last data filed at 10/09/13 0600  Gross per 24 hour  Intake 780.83 ml  Output      0 ml  Net 780.83 ml    Labs:   Recent Labs Lab 10/08/13 1420 10/09/13 0334  NA 134* 138  K 4.6 3.5*  CL 92* 101  CO2 27 26  BUN 15 12  CREATININE 0.60 0.53  CALCIUM 9.2 8.1*  MG  --  1.6  GLUCOSE 603* 177*    CBG (last 3)   Recent Labs  10/09/13 0141 10/09/13 0245 10/09/13 0756  GLUCAP 156* 183* 233*    Scheduled Meds: . aspirin  325 mg Oral Daily  . insulin aspart  0-15 Units Subcutaneous TID WC  . insulin glargine  10 Units Subcutaneous BID  . metoprolol tartrate  25 mg Oral BID  . sodium chloride  3 mL Intravenous Q12H    Continuous Infusions: . sodium chloride 75 mL/hr at 10/09/13 6222    Past Medical History  Diagnosis Date  . Diabetes mellitus   . Hypertension   . Hyperlipidemia   . Arthritis   . Peripheral vascular disease   . Hernia of abdominal wall   . DVT (deep venous  thrombosis)   . Cancer     prostate  . Stroke 1998    History of 2 CVA  two weeks apart    Past Surgical History  Procedure Laterality Date  . Prostate surgery      SEED implant  . Carotid endarterectomy  07-27-10    Right CEA  . Carotid endarterectomy  2003    Left CEA redo  . Carotid endarterectomy  1998    Left CEA  . Coronary artery bypass graft  2012    CABG x 3 vessel by Dr. Cyndia Bent  . Shoulder fusion surgery  1980's    for recurrent dislocation  . Foot surgery    . Pr vein bypass graft,aorto-fem-pop  07/27/10  . Tooth extraction  July 2013    Antonieta Iba, RD, LDN Clinical Inpatient Dietitian Pager:  [phone] Weekend and after hours pager:  [phone]

## 2013-10-09 NOTE — Progress Notes (Addendum)
CSW received follow up from [facility] intake social worker, Darnelle Catalan. Per APS intake Education officer, museum, when disposition is determined to contact APS if pt returns to the community as they feel pt is currently in a safe environment.  CSW awaiting PT/OT evaluations to determine pt disposition needs.   CSW to continue to follow.  Addendum 4:00 pm:  MD notified CSW that PT recommended SNF.  CSW met with pt at bedside. CSW introduced self and explained role.   Patient reports that is from [place], but he is cleaning out the home left by his Aunt as a result of her hoarding. Per ED CSW assessment, Per reports from the patient's wife the information previously reported is inaccurate. According to medical documentation the patient has been living in his Aunt's home for over 2 years after a separation from his wife.  CSW discussed with pt recommendations for rehab at SNF at discharge. Pt expressed hesitancy about short term SNF stating that the people that he has known to go to rehab never leave rehab. CSW discussed that everyone's situation is different and if pt shows motivation in rehab then he will likely progress well and be able to return home. Pt expressed understanding and is agreeable to SNF search. Pt states that he would prefer placement in [facility].  CSW completed FL2 and initiated SNF search to [facility].   CSW to follow up with pt re: SNF bed offers.  CSW to facilitate pt discharge needs when pt medically ready for discharge.    Alison Murray, MSW, [place] Work [phone]

## 2013-10-09 NOTE — Progress Notes (Addendum)
[facility] PROGRESS NOTE  [name] [name] IEP:[id number] DOB: 1935/05/10 DOA: 10/08/2013  PCP: Tamsen Roers, MD  Brief HPI: [name] [name] is a 79 y.o. male with a past medical history of coronary artery disease, status post CABG in 2012, diabetes, hypertension, who was brought in by EMS as the patient had been falling a lot. He has been weak as well over the last couple of weeks. When EMS got to his house he was found to be lying him in human feces. Patient was unkempt with dry blood and feces on the skin. Patient mentioned that he's been out of his diabetic medications for about one to 2 weeks. However, 2 weeks ago he fell on his tailbone while he was getting out of his motor vehicle and he injured that area quite badly. He had severe pain. And, most of his symptoms started after that initial injury.   Past medical history:  Past Medical History  Diagnosis Date  . Diabetes mellitus   . Hypertension   . Hyperlipidemia   . Arthritis   . Peripheral vascular disease   . Hernia of abdominal wall   . DVT (deep venous thrombosis)   . Cancer     prostate  . Stroke 1998    History of 2 CVA  two weeks apart    Consultants: None  Procedures: None  Antibiotics: None  Subjective: Patient feels well this morning. Still weak but no new complaints.  Objective: Vital Signs  Filed Vitals:   10/09/13 0200 10/09/13 0400 10/09/13 0600 10/09/13 0800  BP: 141/58 146/47 149/59   Pulse: 51 50 54   Temp:  97.9 F (36.6 C)  98.1 F (36.7 C)  TempSrc:  Oral  Oral  Resp: 11 11 13    Height:      Weight:      SpO2: 95% 94% 92%     Intake/Output Summary (Last 24 hours) at 10/09/13 1107 Last data filed at 10/09/13 0600  Gross per 24 hour  Intake 780.83 ml  Output      0 ml  Net 780.83 ml   Filed Weights   10/08/13 1900  Weight: 76.25 kg (168 lb 1.6 oz)    General appearance: alert, cooperative, appears stated age, no distress and more alert. Head: Normocephalic, without  obvious abnormality, atraumatic Resp: clear to auscultation bilaterally Cardio: regular rate and rhythm, S1, S2 normal, no murmur, click, rub or gallop GI: soft, non-tender; bowel sounds normal; no masses,  no organomegaly Extremities: extremities normal, atraumatic, no cyanosis or edema Neurologic: Alert and oriented x 3. No focal deficits.  Lab Results:  Basic Metabolic Panel:  Recent Labs Lab 10/08/13 1420 10/09/13 0334  NA 134* 138  K 4.6 3.5*  CL 92* 101  CO2 27 26  GLUCOSE 603* 177*  BUN 15 12  CREATININE 0.60 0.53  CALCIUM 9.2 8.1*  MG  --  1.6   Liver Function Tests:  Recent Labs Lab 10/09/13 0334  AST 13  ALT 9  ALKPHOS 89  BILITOT 0.6  PROT 4.7*  ALBUMIN 2.6*   CBC:  Recent Labs Lab 10/08/13 1420 10/09/13 0334  WBC 8.0 7.0  NEUTROABS 6.2  --   HGB 15.1 13.4  HCT 44.3 38.0*  MCV 93.7 91.3  PLT 141* 87*   CBG:  Recent Labs Lab 10/09/13 0043 10/09/13 0102 10/09/13 0141 10/09/13 0245 10/09/13 0756  GLUCAP 164* 142* 156* 183* 233*    Recent Results (from the past 240 hour(s))  MRSA PCR SCREENING  Status: None   Collection Time    10/08/13  8:12 PM      Result Value Ref Range Status   MRSA by PCR NEGATIVE  NEGATIVE Final   Comment:            The GeneXpert MRSA Assay (FDA     approved for NASAL specimens     only), is one component of a     comprehensive MRSA colonization     surveillance program. It is not     intended to diagnose MRSA     infection nor to guide or     monitor treatment for     MRSA infections.      Studies/Results: Ct Head Wo Contrast  10/08/2013   CLINICAL DATA:  Multiple falls over the last 2-3 weeks.  EXAM: CT HEAD WITHOUT CONTRAST  TECHNIQUE: Contiguous axial images were obtained from the base of the skull through the vertex without intravenous contrast.  COMPARISON:  None.  FINDINGS: There is no evidence of mass effect, midline shift, or extra-axial fluid collections. There is no evidence of a  space-occupying lesion or intracranial hemorrhage. There is no evidence of a cortical-based area of acute infarction. There is an old right basal ganglia lacunar infarct. There is mineralization of the right putaminal. There is generalized cerebral atrophy. There is periventricular white matter low attenuation likely secondary to microangiopathy.  The ventricles and sulci are appropriate for the patient's age. The basal cisterns are patent.  Visualized portions of the orbits are unremarkable. The visualized portions of the paranasal sinuses and mastoid air cells are unremarkable. Cerebrovascular atherosclerotic calcifications are noted.  The osseous structures are unremarkable.  IMPRESSION: No acute intracranial pathology.   Electronically Signed   By: Kathreen Devoid   On: 10/08/2013 14:51   Mr Lumbar Spine Wo Contrast  10/09/2013   CLINICAL DATA:  79 year old male with increased lower extremity weakness. Urinary incontinence, poor rectal tone. Recent fall. Initial encounter.  EXAM: MRI LUMBAR SPINE WITHOUT CONTRAST  TECHNIQUE: Multiplanar, multisequence MR imaging was performed. No intravenous contrast was administered.  COMPARISON:  CT Abdomen and Pelvis 09/02/2007.  FINDINGS: Study is intermittently degraded by motion artifact despite repeated imaging attempts.  Normal lumbar segmentation depicted on comparison. Stable vertebral height and alignment. No marrow edema or evidence of acute osseous abnormality.  Grossly normal visualized lower thoracic spinal cord with conus medullaris at L1.  The bladder is partially visible and appears moderately distended. Negative posterior paraspinal soft tissues. Grossly stable and negative visible abdominal viscera. Chronic diverticulosis of the distal colon.  Detail of the lower thoracic and lumbar thecal sac is limited by motion artifact. The sagittal T1 sequence is relatively free of motion, and there is no evidence of a moderate or severe lower thoracic or lumbar spinal  stenosis on that image. T2 images suggest multilevel disc and facet hypertrophy. T2 images suggest multifactorial mild L4-L5 spinal stenosis. No large disc herniation is evident.  IMPRESSION: 1. Motion degraded exam, but no evidence of large lumbar disc herniation or moderate/severe spinal stenosis. There does appear to be mild degenerative multifactorial spinal stenosis at L4-L5. 2.  No acute osseous abnormality identified in the lumbar spine. 3. Moderate bladder distension. Chronic diverticulosis of the distal colon.   Electronically Signed   By: Lars Pinks M.D.   On: 10/09/2013 07:50    Medications:  Scheduled: . aspirin  325 mg Oral Daily  . insulin aspart  0-15 Units Subcutaneous TID WC  . insulin glargine  10 Units Subcutaneous BID  . metoprolol tartrate  25 mg Oral BID  . sodium chloride  3 mL Intravenous Q12H   Continuous: . sodium chloride 75 mL/hr at 10/09/13 0277   AJO:INOMVEHMCNOBS, acetaminophen, albuterol, cyclobenzaprine, dextrose, hydrALAZINE, HYDROcodone-acetaminophen, ondansetron (ZOFRAN) IV, ondansetron  Assessment/Plan:  Principal Problem:   Hyperglycemia Active Problems:   HYPERTENSION   CAD   Frequent falls   DM (diabetes mellitus), type 2, uncontrolled   Tobacco abuse    Non Ketotic Hyperglycemia in setting of DM2, poorly controlled CBG's have improved. Patient now off of insulin infusion. Now on Lantus and SSI. Adjust dose as required. HBA1c is 14 implying very poor glycemic control.   Frequent falls with urinary and fecal incontinence and lower back pain MRI does not show any concerning changes. Stable neurologically. PT/OT. TSH and B12 was normal. Hyperglycemia could have contributed to his symptoms.  History of coronary artery disease, status post CABG in 2012 This appears to be stable. Patient denies any chest pain. Continue with his beta blocker and his aspirin for now.   History of hypertension Blood pressure not well controlled. May need to add  additional agents. HR is low so cannot increase BB. Hydralazine as needed.   Dehydration and possible malnutrition Improved. Cut back on IVF. Nutritionist to assess.   Thrombocytopenia Drop in counts noted. Stop Heparin. Monitor for now. No bleeding. Etiology remains unclear.  Social situation Education officer, museum address his living situation and self neglect issues.  MRI report suggested distended urinary bladder. Will get bladder scan to begin with. He does have history of prostate cancer.  Code Status: Full Code  DVT Prophylaxis: SCD's    Family Communication: Discussed with patient. No family is available.  Disposition Plan: Transfer to Med Surg.    LOS: 1 day   [facility] Pager [phone] 10/09/2013, 11:07 AM  If 8PM-8AM, please contact night-coverage at [email or web address], password [facility]

## 2013-10-10 LAB — CBC
HCT: 41.2 % (ref 39.0–52.0)
Hemoglobin: 13.8 g/dL (ref 13.0–17.0)
MCH: 31.5 pg (ref 26.0–34.0)
MCHC: 33.5 g/dL (ref 30.0–36.0)
MCV: 94.1 fL (ref 78.0–100.0)
PLATELETS: 116 10*3/uL — AB (ref 150–400)
RBC: 4.38 MIL/uL (ref 4.22–5.81)
RDW: 15.4 % (ref 11.5–15.5)
WBC: 7 10*3/uL (ref 4.0–10.5)

## 2013-10-10 LAB — GLUCOSE, CAPILLARY
GLUCOSE-CAPILLARY: 300 mg/dL — AB (ref 70–99)
Glucose-Capillary: 223 mg/dL — ABNORMAL HIGH (ref 70–99)
Glucose-Capillary: 312 mg/dL — ABNORMAL HIGH (ref 70–99)
Glucose-Capillary: 306 mg/dL — ABNORMAL HIGH (ref 70–99)

## 2013-10-10 LAB — BASIC METABOLIC PANEL
BUN: 14 mg/dL (ref 6–23)
CHLORIDE: 100 meq/L (ref 96–112)
CO2: 24 meq/L (ref 19–32)
Calcium: 8.7 mg/dL (ref 8.4–10.5)
Creatinine, Ser: 0.55 mg/dL (ref 0.50–1.35)
GFR calc non Af Amer: >90 mL/min (ref >90)
Glucose, Bld: 240 mg/dL — ABNORMAL HIGH (ref 70–99)
POTASSIUM: 4.1 meq/L (ref 3.7–5.3)
Sodium: 137 mEq/L (ref 137–147)

## 2013-10-10 LAB — PSA: PSA: <0.01 ng/mL — ABNORMAL LOW (ref <=4.00)

## 2013-10-10 LAB — FREE PSA: PSA, Free: <0.1 ng/mL

## 2013-10-10 MED ORDER — INSULIN GLARGINE 100 UNIT/ML ~~LOC~~ SOLN
15.0000 [IU] | Freq: Two times a day (BID) | SUBCUTANEOUS | Status: DC
  Administered 2013-10-10: 15 [IU] via SUBCUTANEOUS
  Filled 2013-10-10 (×2): qty 0.15

## 2013-10-10 NOTE — Progress Notes (Signed)
Inpatient Diabetes Program Recommendations  AACE/ADA: New Consensus Statement on Inpatient Glycemic Control (2013)  Target Ranges:  Prepandial:   less than 140 mg/dL      Peak postprandial:   less than 180 mg/dL (1-2 hours)      Critically ill patients:  140 - 180 mg/dL   Results for [name] [name], [name] [name] (MRN [id number]) as of 10/10/2013 08:42  Ref. Range 10/09/2013 07:56 10/09/2013 11:24 10/09/2013 17:04 10/09/2013 21:10 10/10/2013 07:28  Glucose-Capillary Latest Range: 70-99 mg/dL 233 (H) 289 (H) 307 (H) 213 (H) 223 (H)   Inpatient Diabetes Program Recommendations Insulin - Basal: Please consider increasing Lantus to 15 units BID. Insulin - Meal Coverage: If patient is eating and tolerating diet, may want to consider ordering Novolog 3 untis TID with meals for meal coverage.  Thanks, Barnie Alderman, RN, MSN, CCRN Diabetes Coordinator Inpatient Diabetes Program [phone] (Team Pager) [phone] (AP office) [phone] [facility] office)

## 2013-10-10 NOTE — Progress Notes (Signed)
Met with Pt and wife at bedside.  Thus far, only [facility] has offered Pt a bed.  Pt and wife interested in [facility] in [place].    CSW to make a referral to that facility on Pt's behalf.  Weekday CSW to follow.  Bernita Raisin, [place] Work [phone]

## 2013-10-10 NOTE — Progress Notes (Signed)
[facility] PROGRESS NOTE  Bastien Strawser TGG:[id number] DOB: 04-01-1935 DOA: 10/08/2013  PCP: Tamsen Roers, MD  Brief HPI: [name] [name] is a 79 y.o. male with a past medical history of coronary artery disease, status post CABG in 2012, diabetes, hypertension, who was brought in by EMS as the patient had been falling a lot. He has been weak as well over the last couple of weeks. When EMS got to his house he was found to be lying him in human feces. Patient was unkempt with dry blood and feces on the skin. Patient mentioned that he's been out of his diabetic medications for about one to 2 weeks. However, 2 weeks ago he fell on his tailbone while he was getting out of his motor vehicle and he injured that area quite badly. He had severe pain. And, most of his symptoms started after that initial injury.   Past medical history:  Past Medical History  Diagnosis Date  . Diabetes mellitus   . Hypertension   . Hyperlipidemia   . Arthritis   . Peripheral vascular disease   . Hernia of abdominal wall   . DVT (deep venous thrombosis)   . Cancer     prostate  . Stroke 1998    History of 2 CVA  two weeks apart    Consultants: None  Procedures: None  Antibiotics: None  Subjective: Patient offers no complaints. Weakness is better. Has been urinating.  Objective: Vital Signs  Filed Vitals:   10/09/13 1600 10/09/13 1807 10/09/13 2112 10/10/13 0627  BP:  140/48 152/63 136/54  Pulse:  65 76 66  Temp: 97.9 F (36.6 C) 98.2 F (36.8 C) 98.2 F (36.8 C) 98 F (36.7 C)  TempSrc: Axillary Oral Oral Oral  Resp:  16 16 16   Height:      Weight:      SpO2:  98% 93% 96%    Intake/Output Summary (Last 24 hours) at 10/10/13 1108 Last data filed at 10/10/13 0900  Gross per 24 hour  Intake 1287.5 ml  Output      0 ml  Net 1287.5 ml   Filed Weights   10/08/13 1900  Weight: 76.25 kg (168 lb 1.6 oz)    General appearance: alert, cooperative, appears stated age, no distress  and more alert. Resp: clear to auscultation bilaterally Cardio: regular rate and rhythm, S1, S2 normal, no murmur, click, rub or gallop GI: soft, non-tender; bowel sounds normal; no masses,  no organomegaly Extremities: extremities normal, atraumatic, no cyanosis or edema Neurologic: Alert and oriented x 3. No focal deficits.  Lab Results:  Basic Metabolic Panel:  Recent Labs Lab 10/08/13 1420 10/09/13 0334 10/10/13 0504  NA 134* 138 137  K 4.6 3.5* 4.1  CL 92* 101 100  CO2 27 26 24   GLUCOSE 603* 177* 240*  BUN 15 12 14   CREATININE 0.60 0.53 0.55  CALCIUM 9.2 8.1* 8.7  MG  --  1.6  --    Liver Function Tests:  Recent Labs Lab 10/09/13 0334  AST 13  ALT 9  ALKPHOS 89  BILITOT 0.6  PROT 4.7*  ALBUMIN 2.6*   CBC:  Recent Labs Lab 10/08/13 1420 10/09/13 0334 10/10/13 0504  WBC 8.0 7.0 7.0  NEUTROABS 6.2  --   --   HGB 15.1 13.4 13.8  HCT 44.3 38.0* 41.2  MCV 93.7 91.3 94.1  PLT 141* 87* 116*   CBG:  Recent Labs Lab 10/09/13 0756 10/09/13 1124 10/09/13 1704 10/09/13 2110 10/10/13 7035  GLUCAP 233* 289* 307* 213* 223*    Recent Results (from the past 240 hour(s))  MRSA PCR SCREENING     Status: None   Collection Time    10/08/13  8:12 PM      Result Value Ref Range Status   MRSA by PCR NEGATIVE  NEGATIVE Final   Comment:            The GeneXpert MRSA Assay (FDA     approved for NASAL specimens     only), is one component of a     comprehensive MRSA colonization     surveillance program. It is not     intended to diagnose MRSA     infection nor to guide or     monitor treatment for     MRSA infections.      Studies/Results: Ct Head Wo Contrast  10/08/2013   CLINICAL DATA:  Multiple falls over the last 2-3 weeks.  EXAM: CT HEAD WITHOUT CONTRAST  TECHNIQUE: Contiguous axial images were obtained from the base of the skull through the vertex without intravenous contrast.  COMPARISON:  None.  FINDINGS: There is no evidence of mass effect,  midline shift, or extra-axial fluid collections. There is no evidence of a space-occupying lesion or intracranial hemorrhage. There is no evidence of a cortical-based area of acute infarction. There is an old right basal ganglia lacunar infarct. There is mineralization of the right putaminal. There is generalized cerebral atrophy. There is periventricular white matter low attenuation likely secondary to microangiopathy.  The ventricles and sulci are appropriate for the patient's age. The basal cisterns are patent.  Visualized portions of the orbits are unremarkable. The visualized portions of the paranasal sinuses and mastoid air cells are unremarkable. Cerebrovascular atherosclerotic calcifications are noted.  The osseous structures are unremarkable.  IMPRESSION: No acute intracranial pathology.   Electronically Signed   By: Kathreen Devoid   On: 10/08/2013 14:51   Mr Lumbar Spine Wo Contrast  10/09/2013   CLINICAL DATA:  79 year old male with increased lower extremity weakness. Urinary incontinence, poor rectal tone. Recent fall. Initial encounter.  EXAM: MRI LUMBAR SPINE WITHOUT CONTRAST  TECHNIQUE: Multiplanar, multisequence MR imaging was performed. No intravenous contrast was administered.  COMPARISON:  CT Abdomen and Pelvis 09/02/2007.  FINDINGS: Study is intermittently degraded by motion artifact despite repeated imaging attempts.  Normal lumbar segmentation depicted on comparison. Stable vertebral height and alignment. No marrow edema or evidence of acute osseous abnormality.  Grossly normal visualized lower thoracic spinal cord with conus medullaris at L1.  The bladder is partially visible and appears moderately distended. Negative posterior paraspinal soft tissues. Grossly stable and negative visible abdominal viscera. Chronic diverticulosis of the distal colon.  Detail of the lower thoracic and lumbar thecal sac is limited by motion artifact. The sagittal T1 sequence is relatively free of motion, and  there is no evidence of a moderate or severe lower thoracic or lumbar spinal stenosis on that image. T2 images suggest multilevel disc and facet hypertrophy. T2 images suggest multifactorial mild L4-L5 spinal stenosis. No large disc herniation is evident.  IMPRESSION: 1. Motion degraded exam, but no evidence of large lumbar disc herniation or moderate/severe spinal stenosis. There does appear to be mild degenerative multifactorial spinal stenosis at L4-L5. 2.  No acute osseous abnormality identified in the lumbar spine. 3. Moderate bladder distension. Chronic diverticulosis of the distal colon.   Electronically Signed   By: Lars Pinks M.D.   On: 10/09/2013 07:50  Medications:  Scheduled: . aspirin  325 mg Oral Daily  . insulin aspart  0-15 Units Subcutaneous TID WC  . insulin glargine  15 Units Subcutaneous BID  . lactose free nutrition  237 mL Oral TID WC  . metoprolol tartrate  25 mg Oral BID  . sodium chloride  3 mL Intravenous Q12H   Continuous:   SFK:[id number], acetaminophen, albuterol, cyclobenzaprine, hydrALAZINE, HYDROcodone-acetaminophen, ondansetron (ZOFRAN) IV, ondansetron  Assessment/Plan:  Principal Problem:   Hyperglycemia Active Problems:   HYPERTENSION   CAD   Frequent falls   DM (diabetes mellitus), type 2, uncontrolled   Tobacco abuse    Non Ketotic Hyperglycemia in setting of DM2, poorly controlled Off of insulin infusion. Will adjust Lantus. Continue SSI. HBA1c is 14 implying very poor glycemic control.   Frequent falls with urinary and fecal incontinence and lower back pain MRI does not show any concerning changes. Stable neurologically. PT/OT following. SNF recommended. TSH and B12 was normal. Hyperglycemia could have contributed to his symptoms.  History of coronary artery disease, status post CABG in 2012 This appears to be stable. Patient denies any chest pain. Continue with his beta blocker and his aspirin for now.   History of hypertension Blood  pressure better today. Hold off on adding additional agents. HR is low so cannot increase BB. Hydralazine as needed.   Dehydration and possible malnutrition Improved. Stop IVF.  Thrombocytopenia Drop in counts noted. Improved today. Stopped Heparin. Monitor for now. No bleeding.  Social situation Education officer, museum address his living situation and self neglect issues.  Distended Urinary Bladder MRI report suggested distended urinary bladder. Bladder scan on 4/10 showed more than 480ml of urine. We observed without intervening and it appears that he did urinate. Will repeat bladder scan today. He will need follow up with Urology. Check PSA since he does have history of prostate cancer.  Code Status: Full Code  DVT Prophylaxis: SCD's    Family Communication: Discussed with patient. No family is available.  Disposition Plan: SNF on 4/13.    LOS: 2 days   [facility] Pager [phone] 10/10/2013, 11:08 AM  If 8PM-8AM, please contact night-coverage at [email or web address], password [facility]

## 2013-10-10 NOTE — Progress Notes (Signed)
Pt states he voided a little while ago, Unknown time- bladder scan showed 207 in bladder.

## 2013-10-11 LAB — GLUCOSE, CAPILLARY
GLUCOSE-CAPILLARY: 264 mg/dL — AB (ref 70–99)
Glucose-Capillary: 346 mg/dL — ABNORMAL HIGH (ref 70–99)
Glucose-Capillary: 311 mg/dL — ABNORMAL HIGH (ref 70–99)
Glucose-Capillary: 245 mg/dL — ABNORMAL HIGH (ref 70–99)

## 2013-10-11 MED ORDER — INSULIN GLARGINE 100 UNIT/ML ~~LOC~~ SOLN
20.0000 [IU] | Freq: Two times a day (BID) | SUBCUTANEOUS | Status: DC
  Administered 2013-10-11 (×2): 20 [IU] via SUBCUTANEOUS
  Filled 2013-10-11 (×3): qty 0.2

## 2013-10-11 NOTE — Progress Notes (Signed)
[facility] PROGRESS NOTE  Nicholad Kautzman RSW:[id number] DOB: Sep 10, 1934 DOA: 10/08/2013  PCP: Tamsen Roers, MD  Brief HPI: [name] [name] is a 79 y.o. male with a past medical history of coronary artery disease, status post CABG in 2012, diabetes, hypertension, who was brought in by EMS as the patient had been falling a lot. He has been weak as well over the last couple of weeks. When EMS got to his house he was found to be lying him in human feces. Patient was unkempt with dry blood and feces on the skin. Patient mentioned that he's been out of his diabetic medications for about one to 2 weeks. However, 2 weeks ago he fell on his tailbone while he was getting out of his motor vehicle and he injured that area quite badly. He had severe pain. And, most of his symptoms started after that initial injury.   Past medical history:  Past Medical History  Diagnosis Date  . Diabetes mellitus   . Hypertension   . Hyperlipidemia   . Arthritis   . Peripheral vascular disease   . Hernia of abdominal wall   . DVT (deep venous thrombosis)   . Cancer     prostate  . Stroke 1998    History of 2 CVA  two weeks apart    Consultants: None  Procedures: None  Antibiotics: None  Subjective: Patient continues to feel well. Still some pain in tailbone area but better. Has been urinating.  Objective: Vital Signs  Filed Vitals:   10/10/13 2132 10/10/13 2256 10/11/13 0025 10/11/13 0359  BP: 187/72 127/50 142/59 139/57  Pulse: 80  76 76  Temp: 98 F (36.7 C)   97.7 F (36.5 C)  TempSrc: Oral   Oral  Resp: 18   16  Height:      Weight:      SpO2: 94%   92%    Intake/Output Summary (Last 24 hours) at 10/11/13 0740 Last data filed at 10/10/13 1836  Gross per 24 hour  Intake    540 ml  Output      0 ml  Net    540 ml   Filed Weights   10/08/13 1900  Weight: 76.25 kg (168 lb 1.6 oz)    General appearance: alert, cooperative, appears stated age, no distress and more  alert. Resp: clear to auscultation bilaterally Cardio: regular rate and rhythm, S1, S2 normal, no murmur, click, rub or gallop GI: soft, non-tender; bowel sounds normal; no masses,  no organomegaly Extremities: extremities normal, atraumatic, no cyanosis or edema Neurologic: Alert and oriented x 3. No focal deficits.  Lab Results:  Basic Metabolic Panel:  Recent Labs Lab 10/08/13 1420 10/09/13 0334 10/10/13 0504  NA 134* 138 137  K 4.6 3.5* 4.1  CL 92* 101 100  CO2 27 26 24   GLUCOSE 603* 177* 240*  BUN 15 12 14   CREATININE 0.60 0.53 0.55  CALCIUM 9.2 8.1* 8.7  MG  --  1.6  --    Liver Function Tests:  Recent Labs Lab 10/09/13 0334  AST 13  ALT 9  ALKPHOS 89  BILITOT 0.6  PROT 4.7*  ALBUMIN 2.6*   CBC:  Recent Labs Lab 10/08/13 1420 10/09/13 0334 10/10/13 0504  WBC 8.0 7.0 7.0  NEUTROABS 6.2  --   --   HGB 15.1 13.4 13.8  HCT 44.3 38.0* 41.2  MCV 93.7 91.3 94.1  PLT 141* 87* 116*   CBG:  Recent Labs Lab 10/09/13 2110 10/10/13 0938  10/10/13 1217 10/10/13 1640 10/10/13 2136  GLUCAP 213* 223* 312* 306* 300*    Recent Results (from the past 240 hour(s))  MRSA PCR SCREENING     Status: None   Collection Time    10/08/13  8:12 PM      Result Value Ref Range Status   MRSA by PCR NEGATIVE  NEGATIVE Final   Comment:            The GeneXpert MRSA Assay (FDA     approved for NASAL specimens     only), is one component of a     comprehensive MRSA colonization     surveillance program. It is not     intended to diagnose MRSA     infection nor to guide or     monitor treatment for     MRSA infections.      Studies/Results: No results found.  Medications:  Scheduled: . aspirin  325 mg Oral Daily  . insulin aspart  0-15 Units Subcutaneous TID WC  . insulin glargine  20 Units Subcutaneous BID  . lactose free nutrition  237 mL Oral TID WC  . metoprolol tartrate  25 mg Oral BID  . sodium chloride  3 mL Intravenous Q12H   Continuous:    QMV:[id number], acetaminophen, albuterol, cyclobenzaprine, hydrALAZINE, HYDROcodone-acetaminophen, ondansetron (ZOFRAN) IV, ondansetron  Assessment/Plan:  Principal Problem:   Hyperglycemia Active Problems:   HYPERTENSION   CAD   Frequent falls   DM (diabetes mellitus), type 2, uncontrolled   Tobacco abuse    DM2, poorly controlled with initial Non Ketotic Hyperglycemia, now resolved CBG continues to be high. Will further increase Lantus. Continue SSI. HBA1c is 14 implying very poor glycemic control.   Frequent falls with urinary and fecal incontinence and lower back pain MRI does not show any concerning changes. Stable neurologically. PT/OT following. SNF recommended. TSH and B12 was normal. Hyperglycemia could have contributed to his symptoms.  History of coronary artery disease, status post CABG in 2012 This appears to be stable. Patient denies any chest pain. Continue with his beta blocker and his aspirin for now.   History of hypertension Blood pressure better. Hold off on adding additional agents. HR was low so will not increase BB. Hydralazine as needed.   Dehydration and possible malnutrition Improved. Stopped IVF.  Thrombocytopenia Drop in counts noted. Improved subsequently. Stopped Heparin. No bleeding.  Social situation Education officer, museum involved.   Distended Urinary Bladder MRI report suggested distended urinary bladder. Bladder scan on 4/10 showed more than 435ml of urine. Bladder scan yestefrday showed 250ml. Patient continues to urinate. PSA is normal. Can be further pursued as OP if indicated. Will defer to PCP.  Code Status: Full Code  DVT Prophylaxis: SCD's    Family Communication: Discussed with patient. No family is available.  Disposition Plan: SNF on 4/13. Can discontinue Tele.    LOS: 3 days   [facility] Pager [phone] 10/11/2013, 7:40 AM  If 8PM-8AM, please contact night-coverage at [email or web address], password  [facility]

## 2013-10-12 DIAGNOSIS — F1020 Alcohol dependence, uncomplicated: Secondary | ICD-10-CM

## 2013-10-12 DIAGNOSIS — D696 Thrombocytopenia, unspecified: Secondary | ICD-10-CM

## 2013-10-12 DIAGNOSIS — F17200 Nicotine dependence, unspecified, uncomplicated: Secondary | ICD-10-CM

## 2013-10-12 HISTORY — DX: Thrombocytopenia, unspecified: D69.6

## 2013-10-12 HISTORY — DX: Nicotine dependence, unspecified, uncomplicated: F17.200

## 2013-10-12 HISTORY — DX: Alcohol dependence, uncomplicated: F10.20

## 2013-10-12 LAB — GLUCOSE, CAPILLARY
GLUCOSE-CAPILLARY: 278 mg/dL — AB (ref 70–99)
Glucose-Capillary: 350 mg/dL — ABNORMAL HIGH (ref 70–99)

## 2013-10-12 MED ORDER — INSULIN GLARGINE 100 UNIT/ML ~~LOC~~ SOLN
24.0000 [IU] | Freq: Two times a day (BID) | SUBCUTANEOUS | Status: DC
  Administered 2013-10-12: 24 [IU] via SUBCUTANEOUS
  Filled 2013-10-12 (×2): qty 0.24

## 2013-10-12 MED ORDER — CYCLOBENZAPRINE HCL 5 MG PO TABS: 5.0000 mg | ORAL_TABLET | Freq: Three times a day (TID) | ORAL | Status: DC | PRN

## 2013-10-12 MED ORDER — INSULIN GLARGINE 100 UNIT/ML ~~LOC~~ SOLN
24.0000 [IU] | Freq: Two times a day (BID) | SUBCUTANEOUS | Status: DC
Start: 2013-10-12 — End: 2014-01-08

## 2013-10-12 MED ORDER — HYDROCODONE-ACETAMINOPHEN 5-325 MG PO TABS: 1.0000 | ORAL_TABLET | ORAL | Status: DC | PRN

## 2013-10-12 MED ORDER — INSULIN ASPART 100 UNIT/ML ~~LOC~~ SOLN: SUBCUTANEOUS | Status: DC

## 2013-10-12 NOTE — Progress Notes (Signed)
Clinical Social Work  CSW met with patient at bedside and offered SNF choice. Patient chose [facility] in [place]. Patient asked that CSW call and inform wife as well. CSW faxed DC summary to [facility] who requested patient be transferred around 12pm. CSW prepared DC packet with FL2 and hard scripts included. Patient prefers [facility] to transport to SNF. Patient, wife, and RN all aware of plans. RN to call report. CSW coordinated transportation via [place]. Request #: [id number].   CSW called DSS and left a message for APS re: patient's DC plans to SNF and left contact information in case APS needed further information. CSW is signing off but available if needed.  Sindy Messing, LCSW (Coverage for [employer])

## 2013-10-12 NOTE — Progress Notes (Signed)
Called report to [facility], SNF and spoke with Anne Ng. Patient ready for discharge. Transportation has been arranged and family has been contacted. J.Shana Younge, RN

## 2013-10-12 NOTE — Discharge Summary (Signed)
[facility]  Physician Discharge Summary   Patient ID: [name] [name] MRN: [id number] DOB/AGE: Sep 12, 1934 79 y.o.  Admit date: 10/08/2013 Discharge date: 10/12/2013  PCP: Tamsen Roers, MD  DISCHARGE DIAGNOSES:  Principal Problem:   Hyperglycemia Active Problems:   HYPERTENSION   CAD   Frequent falls   DM (diabetes mellitus), type 2, uncontrolled   Tobacco abuse   RECOMMENDATIONS FOR OUTPATIENT FOLLOW UP: 1. Check CBG three times daily at SNF and cover with Novolog as instructed.  2. SNF will need to adjust Lantus dose as well depending on CBG levels.  DISCHARGE CONDITION: fair  Diet recommendation: Mod Carb  Filed Weights   10/08/13 1900  Weight: 76.25 kg (168 lb 1.6 oz)    INITIAL HISTORY: [name] [name] is a 79 y.o. male with a past medical history of coronary artery disease, status post CABG in 2012, diabetes, hypertension, who was brought in by EMS as the patient had been falling a lot. He has been weak as well over the last couple of weeks. When EMS got to his house he was found to be lying him in human feces. Patient was unkempt with dry blood and feces on the skin. Patient mentioned that he's been out of his diabetic medications for about one to 2 weeks. However, 2 weeks ago he fell on his tailbone while he was getting out of his motor vehicle and he injured that area quite badly. He had severe pain. And, most of his symptoms started after that initial injury.   Consultations:  None  Procedures:  None  HOSPITAL COURSE:   DM2, poorly controlled with initial Non Ketotic Hyperglycemia, now resolved  He initially required IV insulin. He was then transitioned to lantus. His CBG's are better but Lantus needs to be adjusted further. Continue SSI at SNF. HBA1c is 14 implying very poor glycemic control. Needs follow up with PCP when discharged from SNF.  Frequent falls with urinary and fecal incontinence and lower back pain  He was noted to have bruising  over tailbone area. MRI Lumbar Spine did not show any concerning changes. He was stable neurologically. PT/OT was consulted and they felt he needed SNF for rehabilitation. TSH and B12 was normal. Hyperglycemia could have contributed to his symptoms of unsteady gait and falls. .  History of coronary artery disease, status post CABG in 2012  This appears to be stable. Patient denies any chest pain. Continue with his beta blocker and his aspirin for now.   History of hypertension  Blood pressure was initially high. After initiation of his antihypertensive medications it is better. If BP remains high please consider additional agents such as Amlodipine. No room to increase metoprolol due to slow heart rate.  Dehydration and possible malnutrition  This improved with IV fluids.   Thrombocytopenia  Drop in counts noted initially. Heparin was held. The counts improved subsequently.   Social situation  Patient has apparently separated from his wife. He was living at his Aunt's house and was neglecting health. Social worker was involved. He will be discharged to SNF. CSW to assess home situation when he is discharged from SNF.  Distended Urinary Bladder  MRI report suggested distended urinary bladder. Bladder scan on 4/10 showed more than 422ml of urine. Bladder scan subsequently showed 255ml. Patient has had no difficulty with urination. PSA was checked and was normal. This can be further pursued as OP if indicated. Will defer to PCP.  Patient is feeling better. Further management at SNF. He is stable for  discharge.   PERTINENT LABS:  The results of significant diagnostics from this hospitalization (including imaging, microbiology, ancillary and laboratory) are listed below for reference.    Microbiology: Recent Results (from the past 240 hour(s))  MRSA PCR SCREENING     Status: None   Collection Time    10/08/13  8:12 PM      Result Value Ref Range Status   MRSA by PCR NEGATIVE  NEGATIVE  Final   Comment:            The GeneXpert MRSA Assay (FDA     approved for NASAL specimens     only), is one component of a     comprehensive MRSA colonization     surveillance program. It is not     intended to diagnose MRSA     infection nor to guide or     monitor treatment for     MRSA infections.     Labs: Basic Metabolic Panel:  Recent Labs Lab 10/08/13 1420 10/09/13 0334 10/10/13 0504  NA 134* 138 137  K 4.6 3.5* 4.1  CL 92* 101 100  CO2 27 26 24   GLUCOSE 603* 177* 240*  BUN 15 12 14   CREATININE 0.60 0.53 0.55  CALCIUM 9.2 8.1* 8.7  MG  --  1.6  --    Liver Function Tests:  Recent Labs Lab 10/09/13 0334  AST 13  ALT 9  ALKPHOS 89  BILITOT 0.6  PROT 4.7*  ALBUMIN 2.6*   CBC:  Recent Labs Lab 10/08/13 1420 10/09/13 0334 10/10/13 0504  WBC 8.0 7.0 7.0  NEUTROABS 6.2  --   --   HGB 15.1 13.4 13.8  HCT 44.3 38.0* 41.2  MCV 93.7 91.3 94.1  PLT 141* 87* 116*   CBG:  Recent Labs Lab 10/11/13 0802 10/11/13 1158 10/11/13 1631 10/11/13 2106 10/12/13 0734  GLUCAP 346* 264* 311* 245* 278*     IMAGING STUDIES Ct Head Wo Contrast  10/08/2013   CLINICAL DATA:  Multiple falls over the last 2-3 weeks.  EXAM: CT HEAD WITHOUT CONTRAST  TECHNIQUE: Contiguous axial images were obtained from the base of the skull through the vertex without intravenous contrast.  COMPARISON:  None.  FINDINGS: There is no evidence of mass effect, midline shift, or extra-axial fluid collections. There is no evidence of a space-occupying lesion or intracranial hemorrhage. There is no evidence of a cortical-based area of acute infarction. There is an old right basal ganglia lacunar infarct. There is mineralization of the right putaminal. There is generalized cerebral atrophy. There is periventricular white matter low attenuation likely secondary to microangiopathy.  The ventricles and sulci are appropriate for the patient's age. The basal cisterns are patent.  Visualized portions of  the orbits are unremarkable. The visualized portions of the paranasal sinuses and mastoid air cells are unremarkable. Cerebrovascular atherosclerotic calcifications are noted.  The osseous structures are unremarkable.  IMPRESSION: No acute intracranial pathology.   Electronically Signed   By: Kathreen Devoid   On: 10/08/2013 14:51   Mr Lumbar Spine Wo Contrast  10/09/2013   CLINICAL DATA:  79 year old male with increased lower extremity weakness. Urinary incontinence, poor rectal tone. Recent fall. Initial encounter.  EXAM: MRI LUMBAR SPINE WITHOUT CONTRAST  TECHNIQUE: Multiplanar, multisequence MR imaging was performed. No intravenous contrast was administered.  COMPARISON:  CT Abdomen and Pelvis 09/02/2007.  FINDINGS: Study is intermittently degraded by motion artifact despite repeated imaging attempts.  Normal lumbar segmentation depicted on comparison. Stable vertebral height  and alignment. No marrow edema or evidence of acute osseous abnormality.  Grossly normal visualized lower thoracic spinal cord with conus medullaris at L1.  The bladder is partially visible and appears moderately distended. Negative posterior paraspinal soft tissues. Grossly stable and negative visible abdominal viscera. Chronic diverticulosis of the distal colon.  Detail of the lower thoracic and lumbar thecal sac is limited by motion artifact. The sagittal T1 sequence is relatively free of motion, and there is no evidence of a moderate or severe lower thoracic or lumbar spinal stenosis on that image. T2 images suggest multilevel disc and facet hypertrophy. T2 images suggest multifactorial mild L4-L5 spinal stenosis. No large disc herniation is evident.  IMPRESSION: 1. Motion degraded exam, but no evidence of large lumbar disc herniation or moderate/severe spinal stenosis. There does appear to be mild degenerative multifactorial spinal stenosis at L4-L5. 2.  No acute osseous abnormality identified in the lumbar spine. 3. Moderate bladder  distension. Chronic diverticulosis of the distal colon.   Electronically Signed   By: Lars Pinks M.D.   On: 10/09/2013 07:50    DISCHARGE EXAMINATION: Filed Vitals:   10/11/13 0946 10/11/13 1354 10/11/13 2115 10/12/13 0557  BP: 140/55 125/46 143/59 144/54  Pulse: 76 59 70 69  Temp:  98.3 F (36.8 C) 98.3 F (36.8 C) 98.2 F (36.8 C)  TempSrc:  Oral Oral Oral  Resp:  20 18 18   Height:      Weight:      SpO2:  93% 95% 92%   General appearance: alert, cooperative, appears stated age and no distress Resp: clear to auscultation bilaterally Cardio: regular rate and rhythm, S1, S2 normal, no murmur, click, rub or gallop GI: soft, non-tender; bowel sounds normal; no masses,  no organomegaly  DISPOSITION: SNF  Discharge Orders   Future Orders Complete By Expires   Diet Carb Modified  As directed    Discharge instructions  As directed    Increase activity slowly  As directed       ALLERGIES:  Allergies  Allergen Reactions  . Darvon [Propoxyphene Hcl]     Severe sweating  . Crestor [Rosuvastatin Calcium] Nausea And Vomiting  . Meperidine Hcl     Passes out    Current Discharge Medication List    START taking these medications   Details  cyclobenzaprine (FLEXERIL) 5 MG tablet Take 1 tablet (5 mg total) by mouth 3 (three) times daily as needed for muscle spasms. Qty: 30 tablet, Refills: 0    HYDROcodone-acetaminophen (NORCO/VICODIN) 5-325 MG per tablet Take 1 tablet by mouth every 4 (four) hours as needed for moderate pain. Qty: 30 tablet, Refills: 0      CONTINUE these medications which have CHANGED   Details  insulin aspart (NOVOLOG) 100 UNIT/ML injection Correction coverage: Moderate (average weight, post-op) CBG < 70: No insulin and implement hypoglycemia protocol CBG 70 - 120: 0 units CBG 121 - 150: 2 units CBG 151 - 200: 3 units CBG 201 - 250: 5 units CBG 251 - 300: 8 units and Call MD Qty: 10 mL, Refills: 11    insulin glargine (LANTUS) 100 UNIT/ML injection  Inject 0.24 mLs (24 Units total) into the skin 2 (two) times daily. Qty: 10 mL, Refills: 11      CONTINUE these medications which have NOT CHANGED   Details  aspirin 325 MG tablet Take 325 mg by mouth daily.    metoprolol tartrate (LOPRESSOR) 25 MG tablet Take 25 mg by mouth 2 (two) times daily.  Multiple Vitamin (MULTIVITAMIN) tablet Take 1 tablet by mouth daily.      STOP taking these medications     glimepiride (AMARYL) 2 MG tablet      metFORMIN (GLUCOPHAGE) 500 MG tablet        Follow-up Information   Follow up with LITTLE,JAMES, MD. Schedule an appointment as soon as possible for a visit in 2 weeks.   Specialty:  Family Medicine   Contact information:   [address] [place] [address] [phone]       TOTAL DISCHARGE TIME: 54 mins  Bonnielee Haff  [facility] Pager [phone]  10/12/2013, 8:25 AM

## 2013-10-12 NOTE — Discharge Instructions (Signed)
Type 2 Diabetes Mellitus, Adult Type 2 diabetes mellitus, often simply referred to as type 2 diabetes, is a long-lasting (chronic) disease. In type 2 diabetes, the pancreas does not make enough insulin (a hormone), the cells are less responsive to the insulin that is made (insulin resistance), or both. Normally, insulin moves sugars from food into the tissue cells. The tissue cells use the sugars for energy. The lack of insulin or the lack of normal response to insulin causes excess sugars to build up in the blood instead of going into the tissue cells. As a result, high blood sugar (hyperglycemia) develops. The effect of high sugar (glucose) levels can cause many complications. Type 2 diabetes was also previously called adult-onset diabetes but it can occur at any age.  RISK FACTORS  A person is predisposed to developing type 2 diabetes if someone in the family has the disease and also has one or more of the following primary risk factors:  Overweight.  An inactive lifestyle.  A history of consistently eating high-calorie foods. Maintaining a normal weight and regular physical activity can reduce the chance of developing type 2 diabetes. SYMPTOMS  A person with type 2 diabetes may not show symptoms initially. The symptoms of type 2 diabetes appear slowly. The symptoms include:  Increased thirst (polydipsia).  Increased urination (polyuria).  Increased urination during the night (nocturia).  Weight loss. This weight loss may be rapid.  Frequent, recurring infections.  Tiredness (fatigue).  Weakness.  Vision changes, such as blurred vision.  Fruity smell to your breath.  Abdominal pain.  Nausea or vomiting.  Cuts or bruises which are slow to heal.  Tingling or numbness in the hands or feet. DIAGNOSIS Type 2 diabetes is frequently not diagnosed until complications of diabetes are present. Type 2 diabetes is diagnosed when symptoms or complications are present and when blood  glucose levels are increased. Your blood glucose level may be checked by one or more of the following blood tests:  A fasting blood glucose test. You will not be allowed to eat for at least 8 hours before a blood sample is taken.  A random blood glucose test. Your blood glucose is checked at any time of the day regardless of when you ate.  A hemoglobin A1c blood glucose test. A hemoglobin A1c test provides information about blood glucose control over the previous 3 months.  An oral glucose tolerance test (OGTT). Your blood glucose is measured after you have not eaten (fasted) for 2 hours and then after you drink a glucose-containing beverage. TREATMENT   You may need to take insulin or diabetes medicine daily to keep blood glucose levels in the desired range.  You will need to match insulin dosing with exercise and healthy food choices. The treatment goal is to maintain the before meal blood sugar (preprandial glucose) level at 70 130 mg/dL. HOME CARE INSTRUCTIONS   Have your hemoglobin A1c level checked twice a year.  Perform daily blood glucose monitoring as directed by your caregiver.  Monitor urine ketones when you are ill and as directed by your caregiver.  Take your diabetes medicine or insulin as directed by your caregiver to maintain your blood glucose levels in the desired range.  Never run out of diabetes medicine or insulin. It is needed every day.  Adjust insulin based on your intake of carbohydrates. Carbohydrates can raise blood glucose levels but need to be included in your diet. Carbohydrates provide vitamins, minerals, and fiber which are an essential part of   a healthy diet. Carbohydrates are found in fruits, vegetables, whole grains, dairy products, legumes, and foods containing added sugars.    Eat healthy foods. Alternate 3 meals with 3 snacks.  Lose weight if overweight.  Carry a medical alert card or wear your medical alert jewelry.  Carry a 15 gram  carbohydrate snack with you at all times to treat low blood glucose (hypoglycemia). Some examples of 15 gram carbohydrate snacks include:  Glucose tablets, 3 or 4   Glucose gel, 15 gram tube  Raisins, 2 tablespoons (24 grams)  Jelly beans, 6  Animal crackers, 8  Regular pop, 4 ounces (120 mL)  Gummy treats, 9  Recognize hypoglycemia. Hypoglycemia occurs with blood glucose levels of 70 mg/dL and below. The risk for hypoglycemia increases when fasting or skipping meals, during or after intense exercise, and during sleep. Hypoglycemia symptoms can include:  Tremors or shakes.  Decreased ability to concentrate.  Sweating.  Increased heart rate.  Headache.  Dry mouth.  Hunger.  Irritability.  Anxiety.  Restless sleep.  Altered speech or coordination.  Confusion.  Treat hypoglycemia promptly. If you are alert and able to safely swallow, follow the 15:15 rule:  Take 15 20 grams of rapid-acting glucose or carbohydrate. Rapid-acting options include glucose gel, glucose tablets, or 4 ounces (120 mL) of fruit juice, regular soda, or low fat milk.  Check your blood glucose level 15 minutes after taking the glucose.  Take 15 20 grams more of glucose if the repeat blood glucose level is still 70 mg/dL or below.  Eat a meal or snack within 1 hour once blood glucose levels return to normal.    Be alert to polyuria and polydipsia which are early signs of hyperglycemia. An early awareness of hyperglycemia allows for prompt treatment. Treat hyperglycemia as directed by your caregiver.  Engage in at least 150 minutes of moderate-intensity physical activity a week, spread over at least 3 days of the week or as directed by your caregiver. In addition, you should engage in resistance exercise at least 2 times a week or as directed by your caregiver.  Adjust your medicine and food intake as needed if you start a new exercise or sport.  Follow your sick day plan at any time you  are unable to eat or drink as usual.  Avoid tobacco use.  Limit alcohol intake to no more than 1 drink per day for nonpregnant women and 2 drinks per day for men. You should drink alcohol only when you are also eating food. Talk with your caregiver whether alcohol is safe for you. Tell your caregiver if you drink alcohol several times a week.  Follow up with your caregiver regularly.  Schedule an eye exam soon after the diagnosis of type 2 diabetes and then annually.  Perform daily skin and foot care. Examine your skin and feet daily for cuts, bruises, redness, nail problems, bleeding, blisters, or sores. A foot exam by a caregiver should be done annually.  Brush your teeth and gums at least twice a day and floss at least once a day. Follow up with your dentist regularly.  Share your diabetes management plan with your workplace or school.  Stay up-to-date with immunizations.  Learn to manage stress.  Obtain ongoing diabetes education and support as needed.  Participate in, or seek rehabilitation as needed to maintain or improve independence and quality of life. Request a physical or occupational therapy referral if you are having foot or hand numbness or difficulties with grooming,   dressing, eating, or physical activity. SEEK MEDICAL CARE IF:   You are unable to eat food or drink fluids for more than 6 hours.  You have nausea and vomiting for more than 6 hours.  Your blood glucose level is over 240 mg/dL.  There is a change in mental status.  You develop an additional serious illness.  You have diarrhea for more than 6 hours.  You have been sick or have had a fever for a couple of days and are not getting better.  You have pain during any physical activity.  SEEK IMMEDIATE MEDICAL CARE IF:  You have difficulty breathing.  You have moderate to large ketone levels. MAKE SURE YOU:  Understand these instructions.  Will watch your condition.  Will get help right away if  you are not doing well or get worse. Document Released: 06/18/2005 Document Revised: 03/12/2012 Document Reviewed: 01/15/2012 [employer] Patient Information 2014 [employer].  

## 2013-11-27 ENCOUNTER — Encounter: Payer: Self-pay | Admitting: Cardiovascular Disease

## 2013-11-27 ENCOUNTER — Ambulatory Visit (INDEPENDENT_AMBULATORY_CARE_PROVIDER_SITE_OTHER): Payer: Medicare Other | Admitting: Cardiovascular Disease

## 2013-11-27 VITALS — BP 168/76 | HR 82 | Ht 69.5 in | Wt 187.2 lb

## 2013-11-27 DIAGNOSIS — R002 Palpitations: Secondary | ICD-10-CM

## 2013-11-27 DIAGNOSIS — I2510 Atherosclerotic heart disease of native coronary artery without angina pectoris: Secondary | ICD-10-CM

## 2013-11-27 DIAGNOSIS — E785 Hyperlipidemia, unspecified: Secondary | ICD-10-CM

## 2013-11-27 DIAGNOSIS — I10 Essential (primary) hypertension: Secondary | ICD-10-CM

## 2013-11-27 DIAGNOSIS — I499 Cardiac arrhythmia, unspecified: Secondary | ICD-10-CM

## 2013-11-27 DIAGNOSIS — I70219 Atherosclerosis of native arteries of extremities with intermittent claudication, unspecified extremity: Secondary | ICD-10-CM

## 2013-11-27 DIAGNOSIS — I6529 Occlusion and stenosis of unspecified carotid artery: Secondary | ICD-10-CM

## 2013-11-27 NOTE — Assessment & Plan Note (Signed)
The patient has had angiography in the past by Dr. Donnetta Hutching who follows his claudication.

## 2013-11-27 NOTE — Assessment & Plan Note (Signed)
History of bilateral carotid endarterectomies performed by Dr. Curt Jews

## 2013-11-27 NOTE — Assessment & Plan Note (Addendum)
Hypertension borderline controlled on current medications. I am going to increased his beta blocker

## 2013-11-27 NOTE — Assessment & Plan Note (Signed)
Patient was seen by his primary care physician Dr. Tamsen Roers in [facility] recently. He noted an irregular heartbeat and the patient was referred here for further evaluation. There is no prior history of atrial fibrillation. The patient is completely asymptomatic and denies palpitations.

## 2013-11-27 NOTE — Assessment & Plan Note (Addendum)
History of CAD status post coronary artery bypass grafting by Dr. Arvid Right 07/27/10..he had a LIMA to the LAD, vein graft to diagonal branch, obtuse marginal branch and to the PDA. It seems that he has not had cardiology followup since

## 2013-11-27 NOTE — Progress Notes (Signed)
11/27/2013 Kathleen Argue   Jul 27, 1934  [id number]  Primary Physician Tamsen Roers, MD Primary Cardiologist: Lorretta Harp MD Renae Gloss   HPI:  Mr. Gabrielsen is an 79 year old [place] Caucasian male father of 68, crit father of her grandchildren worked for [employer] for 35 years, currently retired. He was referred by Dr. Merla Riches for cardiovascular evaluation because of a skipped heartbeat. He has a long history of tobacco abuse having quit 2 years ago and smoked 90 pack years. His other problems include a history of hyperlipidemia, hypertension and diabetes. He does have peripheral vascular disease has had 2 carotid endarterectomies in the past with no claudication followed by Dr. Donnetta Hutching. He is to have coronary artery bypass grafting x4 by Dr. Linzie Collin on 07/27/10 but has not had followup since. He denies chest pain or shortness of breath. During a routine office visit recently Dr. Rex Kras noted that his heartbeat was irregular and was referred here for further evaluation.   Current Outpatient Prescriptions  Medication Sig Dispense Refill  . aspirin 325 MG tablet Take 325 mg by mouth daily.      . insulin aspart (NOVOLOG) 100 UNIT/ML injection Correction coverage: Moderate (average weight, post-op) CBG < 70: No insulin and implement hypoglycemia protocol CBG 70 - 120: 0 units CBG 121 - 150: 2 units CBG 151 - 200: 3 units CBG 201 - 250: 5 units CBG 251 - 300: 8 units and Call MD  10 mL  11  . insulin glargine (LANTUS) 100 UNIT/ML injection Inject 0.24 mLs (24 Units total) into the skin 2 (two) times daily.  10 mL  11  . metFORMIN (GLUCOPHAGE) 500 MG tablet Take 1 tablet by mouth 2 (two) times daily.      . metoprolol tartrate (LOPRESSOR) 25 MG tablet Take 37.5 mg by mouth 2 (two) times daily.       . Multiple Vitamin (MULTIVITAMIN) tablet Take 1 tablet by mouth daily.       No current facility-administered medications for this visit.    Allergies  Allergen Reactions  .  Darvon [Propoxyphene Hcl]     Severe sweating  . Crestor [Rosuvastatin Calcium] Nausea And Vomiting  . Meperidine Hcl     Passes out    History   Social History  . Marital Status: Married    Spouse Name: N/A    Number of Children: N/A  . Years of Education: N/A   Occupational History  . Not on file.   Social History Main Topics  . Smoking status: Former Smoker -- 0.25 packs/day for 50 years    Types: Cigarettes    Quit date: 11/28/2011  . Smokeless tobacco: Former Systems developer    Quit date: 06/27/2011  . Alcohol Use: No  . Drug Use: No  . Sexual Activity: No   Other Topics Concern  . Not on file   Social History Narrative  . No narrative on file     Review of Systems: General: negative for chills, fever, night sweats or weight changes.  Cardiovascular: negative for chest pain, dyspnea on exertion, edema, orthopnea, palpitations, paroxysmal nocturnal dyspnea or shortness of breath Dermatological: negative for rash Respiratory: negative for cough or wheezing Urologic: negative for hematuria Abdominal: negative for nausea, vomiting, diarrhea, bright red blood per rectum, melena, or hematemesis Neurologic: negative for visual changes, syncope, or dizziness All other systems reviewed and are otherwise negative except as noted above.    Blood pressure 168/76, pulse 82, height 5' 9.5" (1.765 m), weight  187 lb 3.2 oz (84.913 kg).  General appearance: alert Neck: no adenopathy, no carotid bruit, no JVD, supple, symmetrical, trachea midline and thyroid not enlarged, symmetric, no tenderness/mass/nodules Lungs: clear to auscultation bilaterally Heart: regular rate and rhythm, S1, S2 normal, no murmur, click, rub or gallop Extremities: extremities normal, atraumatic, no cyanosis or edema  EKG sinus rhythm at 82 with right bundle branch block  and left anterior fascicular block  ASSESSMENT AND PLAN:   HYPERTENSION Hypertension borderline controlled on current medications. I am  going to increased his beta blocker  HYPERLIPIDEMIA Not on statin therapy because of intolerance  Occlusion and stenosis of carotid artery without mention of cerebral infarction History of bilateral carotid endarterectomies performed by Dr. Curt Jews  Atherosclerosis of native arteries of the extremities with intermittent claudication The patient has had angiography in the past by Dr. Donnetta Hutching who follows his claudication.  CAD History of CAD status post coronary artery bypass grafting by Dr. Arvid Right 07/27/10..he had a LIMA to the LAD, vein graft to diagonal branch, obtuse marginal branch and to the PDA. It seems that he has not had cardiology followup since  Irregular heartbeat Patient was seen by his primary care physician Dr. Tamsen Roers in [facility] recently. He noted an irregular heartbeat and the patient was referred here for further evaluation. There is no prior history of atrial fibrillation. The patient is completely asymptomatic and denies palpitations.      Lorretta Harp MD FACP,FACC,FAHA, [facility] 11/27/2013 12:37 PM

## 2013-11-27 NOTE — Patient Instructions (Addendum)
Your physician recommends that you schedule a follow-up appointment in: 3 Months  Your physician has requested that you have an echocardiogram. Echocardiography is a painless test that uses sound waves to create images of your heart. It provides your doctor with information about the size and shape of your heart and how well your heart's chambers and valves are working. This procedure takes approximately one hour. There are no restrictions for this procedure.  Your physician has recommended that you wear an event monitor. Event monitors are medical devices that record the heart's electrical activity. Doctors most often [place] these monitors to diagnose arrhythmias. Arrhythmias are problems with the speed or rhythm of the heartbeat. The monitor is a small, portable device. You can wear one while you do your normal daily activities. This is usually used to diagnose what is causing palpitations/syncope (passing out). [place] has recommended you make the following change in your medication: Increase Metoprolol to 1 1/2 tablets twice daily

## 2013-11-27 NOTE — Assessment & Plan Note (Signed)
Not on statin therapy because of intolerance

## 2013-12-09 ENCOUNTER — Other Ambulatory Visit (HOSPITAL_COMMUNITY): Payer: Self-pay | Admitting: Cardiovascular Disease

## 2013-12-09 ENCOUNTER — Ambulatory Visit (HOSPITAL_COMMUNITY)
Admission: RE | Admit: 2013-12-09 | Discharge: 2013-12-09 | Disposition: A | Discharge status: Home / Self Care | Payer: Medicare Other | Source: Ambulatory Visit | Attending: Cardiovascular Disease | Admitting: Cardiovascular Disease

## 2013-12-09 DIAGNOSIS — R002 Palpitations: Secondary | ICD-10-CM | POA: Insufficient documentation

## 2013-12-09 DIAGNOSIS — I517 Cardiomegaly: Secondary | ICD-10-CM

## 2013-12-10 NOTE — Progress Notes (Signed)
Echo performed 12/09/2013, [name] [name] , RCS

## 2013-12-15 ENCOUNTER — Encounter: Payer: Self-pay | Admitting: *Deleted

## 2013-12-21 ENCOUNTER — Telehealth: Payer: Self-pay | Admitting: Cardiovascular Disease

## 2013-12-21 NOTE — Telephone Encounter (Signed)
[name] [name] states that his father has not received any test results.

## 2013-12-21 NOTE — Telephone Encounter (Signed)
Echo results given to [name] [name] his son and Event Monitor results will be discussed at his office visit 01/08/14.

## 2013-12-24 ENCOUNTER — Encounter: Payer: Self-pay | Admitting: *Deleted

## 2014-01-05 ENCOUNTER — Encounter: Payer: Self-pay | Admitting: Cardiovascular Disease

## 2014-01-08 ENCOUNTER — Ambulatory Visit (INDEPENDENT_AMBULATORY_CARE_PROVIDER_SITE_OTHER): Payer: Medicare Other | Admitting: Cardiovascular Disease

## 2014-01-08 ENCOUNTER — Encounter: Payer: Self-pay | Admitting: Cardiovascular Disease

## 2014-01-08 VITALS — BP 147/58 | HR 79 | Ht 70.0 in | Wt 188.0 lb

## 2014-01-08 DIAGNOSIS — R011 Cardiac murmur, unspecified: Secondary | ICD-10-CM

## 2014-01-08 DIAGNOSIS — I10 Essential (primary) hypertension: Secondary | ICD-10-CM

## 2014-01-08 DIAGNOSIS — E785 Hyperlipidemia, unspecified: Secondary | ICD-10-CM

## 2014-01-08 DIAGNOSIS — I2510 Atherosclerotic heart disease of native coronary artery without angina pectoris: Secondary | ICD-10-CM

## 2014-01-08 DIAGNOSIS — I499 Cardiac arrhythmia, unspecified: Secondary | ICD-10-CM

## 2014-01-08 NOTE — Assessment & Plan Note (Signed)
Controlled on current medications 

## 2014-01-08 NOTE — Assessment & Plan Note (Signed)
Not on statin therapy. Followed by his PCP

## 2014-01-08 NOTE — Assessment & Plan Note (Signed)
Recent 2-D echocardiogram showed no valvular abnormalities with normal LV function

## 2014-01-08 NOTE — Patient Instructions (Signed)
We request that you follow-up in: 6 months with an extender and in 1 year with Dr Berry  You will receive a reminder letter in the mail two months in advance. If you don't receive a letter, please call our office to schedule the follow-up appointment.   

## 2014-01-08 NOTE — Assessment & Plan Note (Signed)
The patient was sent to me for a history of palpitations. The monitor showed scattered PVCs with some episodes of sinus tachycardia but otherwise no significant arrhythmias. The patient is unaware of any palpitations. He is on a beta blocker.

## 2014-01-08 NOTE — Progress Notes (Signed)
01/08/2014 Kathleen Argue   08/23/1934  [id number]  Primary Physician Tamsen Roers, MD Primary Cardiologist: Lorretta Harp MD Renae Gloss   HPI:  [name] [name] is an 79 year old Caucasian male father of 8, who  worked for [employer] for 35 years, currently retired. He was referred by Dr. Tamsen Roers for cardiovascular evaluation because of a skipped heartbeat. He has a long history of tobacco abuse having quit 2 years ago and smoked 90 pack years. His other problems include a history of hyperlipidemia, hypertension and diabetes. He does have peripheral vascular disease has had 2 carotid endarterectomies in the past with no claudication followed by Dr. Donnetta Hutching. He is to have coronary artery bypass grafting x4 by Dr. Cyndia Bent on 07/27/10 but has not had followup since. He denies chest pain or shortness of breath. During a routine office visit recently Dr. Rex Kras noted that his heartbeat was irregular and was referred here for further evaluation. I performed a 2-D echocardiogram which was entirely normal and the monitor showed occasional isolated PVCs with some episodes of sinus tachycardia which he was unaware of.    Current Outpatient Prescriptions  Medication Sig Dispense Refill  . aspirin 325 MG tablet Take 325 mg by mouth daily.      . insulin aspart (NOVOLOG) 100 UNIT/ML injection Inject 6 Units into the skin once. 6 units for any reading over 150      . insulin glargine (LANTUS) 100 UNIT/ML injection Inject 40 Units into the skin daily.      . metFORMIN (GLUCOPHAGE) 500 MG tablet Take 1 tablet by mouth 2 (two) times daily.      . metoprolol tartrate (LOPRESSOR) 25 MG tablet Take 37.5 mg by mouth 2 (two) times daily.       . Multiple Vitamin (MULTIVITAMIN) tablet Take 1 tablet by mouth daily.       No current facility-administered medications for this visit.    Allergies  Allergen Reactions  . Darvon [Propoxyphene Hcl]     Severe sweating  . Crestor [Rosuvastatin  Calcium] Nausea And Vomiting  . Meperidine Hcl     Passes out    History   Social History  . Marital Status: Married    Spouse Name: N/A    Number of Children: N/A  . Years of Education: N/A   Occupational History  . Not on file.   Social History Main Topics  . Smoking status: Former Smoker -- 0.25 packs/day for 50 years    Types: Cigarettes    Quit date: 11/28/2011  . Smokeless tobacco: Former Systems developer    Quit date: 06/27/2011  . Alcohol Use: No  . Drug Use: No  . Sexual Activity: No   Other Topics Concern  . Not on file   Social History Narrative  . No narrative on file     Review of Systems: General: negative for chills, fever, night sweats or weight changes.  Cardiovascular: negative for chest pain, dyspnea on exertion, edema, orthopnea, palpitations, paroxysmal nocturnal dyspnea or shortness of breath Dermatological: negative for rash Respiratory: negative for cough or wheezing Urologic: negative for hematuria Abdominal: negative for nausea, vomiting, diarrhea, bright red blood per rectum, melena, or hematemesis Neurologic: negative for visual changes, syncope, or dizziness All other systems reviewed and are otherwise negative except as noted above.    Blood pressure 147/58, pulse 79, height 5\' 10"  (1.778 m), weight 188 lb (85.276 kg).  General appearance: alert and no distress Neck: no adenopathy, no carotid bruit, no  JVD, supple, symmetrical, trachea midline and thyroid not enlarged, symmetric, no tenderness/mass/nodules Lungs: clear to auscultation bilaterally Heart: regular rate and rhythm, S1, S2 normal, no murmur, click, rub or gallop Extremities: extremities normal, atraumatic, no cyanosis or edema  EKG not performed today  ASSESSMENT AND PLAN:   Irregular heartbeat The patient was sent to me for a history of palpitations. The monitor showed scattered PVCs with some episodes of sinus tachycardia but otherwise no significant arrhythmias. The patient is  unaware of any palpitations. He is on a beta blocker.  CARDIAC MURMUR Recent 2-D echocardiogram showed no valvular abnormalities with normal LV function  HYPERTENSION Controlled on current medications  HYPERLIPIDEMIA Not on statin therapy. Followed by his PCP      Lorretta Harp MD [facility], [facility] 01/08/2014 12:36 PM

## 2014-02-01 ENCOUNTER — Emergency Department: Payer: Self-pay | Admitting: Emergency Medicine

## 2014-02-01 LAB — URINALYSIS, COMPLETE
BACTERIA: NONE SEEN
BILIRUBIN, UR: NEGATIVE
Nitrite: NEGATIVE
PROTEIN: NEGATIVE
Ph: 5 (ref 4.5–8.0)
SPECIFIC GRAVITY: 1.024 (ref 1.003–1.030)
Squamous Epithelial: <1

## 2014-02-01 LAB — COMPREHENSIVE METABOLIC PANEL
ALK PHOS: 151 U/L — AB
Albumin: 3.7 g/dL (ref 3.4–5.0)
Anion Gap: 12 (ref 7–16)
BUN: 32 mg/dL — AB (ref 7–18)
Bilirubin,Total: 0.5 mg/dL (ref 0.2–1.0)
CALCIUM: 9.1 mg/dL (ref 8.5–10.1)
CHLORIDE: 95 mmol/L — AB (ref 98–107)
CREATININE: 1.25 mg/dL (ref 0.60–1.30)
Co2: 25 mmol/L (ref 21–32)
EGFR (African American): >60
GFR CALC NON AF AMER: 55 — AB
Glucose: 660 mg/dL (ref 65–99)
OSMOLALITY: 303 (ref 275–301)
Potassium: 4.5 mmol/L (ref 3.5–5.1)
SGOT(AST): 16 U/L (ref 15–37)
SGPT (ALT): 22 U/L
SODIUM: 132 mmol/L — AB (ref 136–145)
Total Protein: 7.2 g/dL (ref 6.4–8.2)

## 2014-02-01 LAB — CBC
HCT: 46.5 % (ref 40.0–52.0)
HGB: 15.1 g/dL (ref 13.0–18.0)
MCH: 31.3 pg (ref 26.0–34.0)
MCHC: 32.5 g/dL (ref 32.0–36.0)
MCV: 97 fL (ref 80–100)
PLATELETS: 163 10*3/uL (ref 150–440)
RBC: 4.82 10*6/uL (ref 4.40–5.90)
RDW: 14.3 % (ref 11.5–14.5)
WBC: 12.6 10*3/uL — ABNORMAL HIGH (ref 3.8–10.6)

## 2014-02-03 ENCOUNTER — Emergency Department: Payer: Self-pay | Admitting: Emergency Medicine

## 2014-02-03 LAB — COMPREHENSIVE METABOLIC PANEL
ALBUMIN: 3.4 g/dL (ref 3.4–5.0)
ALK PHOS: 120 U/L — AB
Anion Gap: 7 (ref 7–16)
BUN: 15 mg/dL (ref 7–18)
Bilirubin,Total: 0.6 mg/dL (ref 0.2–1.0)
Calcium, Total: 9.3 mg/dL (ref 8.5–10.1)
Chloride: 98 mmol/L (ref 98–107)
Co2: 28 mmol/L (ref 21–32)
Creatinine: 0.94 mg/dL (ref 0.60–1.30)
EGFR (African American): >60
EGFR (Non-African Amer.): >60
Glucose: 395 mg/dL — ABNORMAL HIGH (ref 65–99)
Osmolality: 284 (ref 275–301)
Potassium: 4.4 mmol/L (ref 3.5–5.1)
SGOT(AST): 23 U/L (ref 15–37)
SGPT (ALT): 21 U/L
Sodium: 133 mmol/L — ABNORMAL LOW (ref 136–145)
TOTAL PROTEIN: 7.1 g/dL (ref 6.4–8.2)

## 2014-02-03 LAB — CBC
HCT: 42.4 % (ref 40.0–52.0)
HGB: 13.8 g/dL (ref 13.0–18.0)
MCH: 30.9 pg (ref 26.0–34.0)
MCHC: 32.5 g/dL (ref 32.0–36.0)
MCV: 95 fL (ref 80–100)
Platelet: 149 10*3/uL — ABNORMAL LOW (ref 150–440)
RBC: 4.46 10*6/uL (ref 4.40–5.90)
RDW: 14.4 % (ref 11.5–14.5)
WBC: 8.2 10*3/uL (ref 3.8–10.6)

## 2014-02-03 LAB — URINALYSIS, COMPLETE
BACTERIA: NONE SEEN
BILIRUBIN, UR: NEGATIVE
Blood: NEGATIVE
Glucose,UR: >=500 mg/dL (ref 0–75)
Ketone: NEGATIVE
Leukocyte Esterase: NEGATIVE
NITRITE: NEGATIVE
Ph: 7 (ref 4.5–8.0)
Protein: NEGATIVE
SPECIFIC GRAVITY: 1.007 (ref 1.003–1.030)
SQUAMOUS EPITHELIAL: NONE SEEN
WBC UR: 2 /HPF (ref 0–5)

## 2014-02-08 ENCOUNTER — Inpatient Hospital Stay (HOSPITAL_COMMUNITY)
Admission: EM | Admit: 2014-02-08 | Discharge: 2014-02-11 | DRG: 638 | Disposition: A | Discharge status: Home / Self Care | Payer: Medicare Other | Attending: Internal Medicine | Admitting: Internal Medicine

## 2014-02-08 ENCOUNTER — Inpatient Hospital Stay (HOSPITAL_COMMUNITY): Payer: Medicare Other

## 2014-02-08 ENCOUNTER — Encounter (HOSPITAL_COMMUNITY): Payer: Self-pay | Admitting: Emergency Medicine

## 2014-02-08 DIAGNOSIS — E1165 Type 2 diabetes mellitus with hyperglycemia: Secondary | ICD-10-CM

## 2014-02-08 DIAGNOSIS — C61 Malignant neoplasm of prostate: Secondary | ICD-10-CM

## 2014-02-08 DIAGNOSIS — Z7982 Long term (current) use of aspirin: Secondary | ICD-10-CM | POA: Diagnosis not present

## 2014-02-08 DIAGNOSIS — Z79899 Other long term (current) drug therapy: Secondary | ICD-10-CM | POA: Diagnosis not present

## 2014-02-08 DIAGNOSIS — Z720 Tobacco use: Secondary | ICD-10-CM

## 2014-02-08 DIAGNOSIS — I10 Essential (primary) hypertension: Secondary | ICD-10-CM

## 2014-02-08 DIAGNOSIS — R32 Unspecified urinary incontinence: Secondary | ICD-10-CM | POA: Diagnosis present

## 2014-02-08 DIAGNOSIS — I5032 Chronic diastolic (congestive) heart failure: Secondary | ICD-10-CM | POA: Diagnosis present

## 2014-02-08 DIAGNOSIS — I2510 Atherosclerotic heart disease of native coronary artery without angina pectoris: Secondary | ICD-10-CM | POA: Diagnosis present

## 2014-02-08 DIAGNOSIS — Z794 Long term (current) use of insulin: Secondary | ICD-10-CM | POA: Diagnosis not present

## 2014-02-08 DIAGNOSIS — Z87891 Personal history of nicotine dependence: Secondary | ICD-10-CM | POA: Diagnosis not present

## 2014-02-08 DIAGNOSIS — Z8249 Family history of ischemic heart disease and other diseases of the circulatory system: Secondary | ICD-10-CM | POA: Diagnosis not present

## 2014-02-08 DIAGNOSIS — Z9181 History of falling: Secondary | ICD-10-CM

## 2014-02-08 DIAGNOSIS — IMO0002 Reserved for concepts with insufficient information to code with codable children: Secondary | ICD-10-CM

## 2014-02-08 DIAGNOSIS — Z66 Do not resuscitate: Secondary | ICD-10-CM | POA: Diagnosis present

## 2014-02-08 DIAGNOSIS — E1100 Type 2 diabetes mellitus with hyperosmolarity without nonketotic hyperglycemic-hyperosmolar coma (NKHHC): Secondary | ICD-10-CM | POA: Diagnosis present

## 2014-02-08 DIAGNOSIS — R350 Frequency of micturition: Secondary | ICD-10-CM | POA: Diagnosis not present

## 2014-02-08 DIAGNOSIS — I63239 Cerebral infarction due to unspecified occlusion or stenosis of unspecified carotid arteries: Secondary | ICD-10-CM

## 2014-02-08 DIAGNOSIS — Z8673 Personal history of transient ischemic attack (TIA), and cerebral infarction without residual deficits: Secondary | ICD-10-CM

## 2014-02-08 DIAGNOSIS — Z86718 Personal history of other venous thrombosis and embolism: Secondary | ICD-10-CM | POA: Diagnosis not present

## 2014-02-08 DIAGNOSIS — R011 Cardiac murmur, unspecified: Secondary | ICD-10-CM

## 2014-02-08 DIAGNOSIS — Z888 Allergy status to other drugs, medicaments and biological substances status: Secondary | ICD-10-CM | POA: Diagnosis not present

## 2014-02-08 DIAGNOSIS — Z951 Presence of aortocoronary bypass graft: Secondary | ICD-10-CM

## 2014-02-08 DIAGNOSIS — R7309 Other abnormal glucose: Secondary | ICD-10-CM

## 2014-02-08 DIAGNOSIS — E785 Hyperlipidemia, unspecified: Secondary | ICD-10-CM | POA: Diagnosis present

## 2014-02-08 DIAGNOSIS — I739 Peripheral vascular disease, unspecified: Secondary | ICD-10-CM | POA: Diagnosis present

## 2014-02-08 DIAGNOSIS — I70219 Atherosclerosis of native arteries of extremities with intermittent claudication, unspecified extremity: Secondary | ICD-10-CM

## 2014-02-08 DIAGNOSIS — F17200 Nicotine dependence, unspecified, uncomplicated: Secondary | ICD-10-CM

## 2014-02-08 DIAGNOSIS — I6529 Occlusion and stenosis of unspecified carotid artery: Secondary | ICD-10-CM

## 2014-02-08 DIAGNOSIS — E119 Type 2 diabetes mellitus without complications: Secondary | ICD-10-CM

## 2014-02-08 DIAGNOSIS — Z8546 Personal history of malignant neoplasm of prostate: Secondary | ICD-10-CM | POA: Diagnosis not present

## 2014-02-08 DIAGNOSIS — IMO0001 Reserved for inherently not codable concepts without codable children: Secondary | ICD-10-CM

## 2014-02-08 DIAGNOSIS — I160 Hypertensive urgency: Secondary | ICD-10-CM

## 2014-02-08 DIAGNOSIS — I6350 Cerebral infarction due to unspecified occlusion or stenosis of unspecified cerebral artery: Secondary | ICD-10-CM

## 2014-02-08 DIAGNOSIS — I499 Cardiac arrhythmia, unspecified: Secondary | ICD-10-CM

## 2014-02-08 DIAGNOSIS — R296 Repeated falls: Secondary | ICD-10-CM

## 2014-02-08 DIAGNOSIS — R739 Hyperglycemia, unspecified: Secondary | ICD-10-CM

## 2014-02-08 LAB — COMPREHENSIVE METABOLIC PANEL
ALK PHOS: 148 U/L — AB (ref 39–117)
ALT: 16 U/L (ref 0–53)
ANION GAP: 14 (ref 5–15)
AST: 16 U/L (ref 0–37)
Albumin: 3.6 g/dL (ref 3.5–5.2)
BUN: 23 mg/dL (ref 6–23)
CALCIUM: 9.5 mg/dL (ref 8.4–10.5)
CO2: 26 meq/L (ref 19–32)
Chloride: 94 mEq/L — ABNORMAL LOW (ref 96–112)
Creatinine, Ser: 0.83 mg/dL (ref 0.50–1.35)
GFR calc Af Amer: >90 mL/min (ref >90)
GFR calc non Af Amer: 83 mL/min — ABNORMAL LOW (ref >90)
Glucose, Bld: 612 mg/dL (ref 70–99)
Potassium: 4.9 mEq/L (ref 3.7–5.3)
Sodium: 134 mEq/L — ABNORMAL LOW (ref 137–147)
TOTAL PROTEIN: 6.5 g/dL (ref 6.0–8.3)
Total Bilirubin: 0.4 mg/dL (ref 0.3–1.2)

## 2014-02-08 LAB — URINALYSIS, ROUTINE W REFLEX MICROSCOPIC
BILIRUBIN URINE: NEGATIVE
Hgb urine dipstick: NEGATIVE
KETONES UR: NEGATIVE mg/dL
Leukocytes, UA: NEGATIVE
Nitrite: NEGATIVE
Protein, ur: NEGATIVE mg/dL
Specific Gravity, Urine: 1.014 (ref 1.005–1.030)
Urobilinogen, UA: 0.2 mg/dL (ref 0.0–1.0)
pH: 7.5 (ref 5.0–8.0)

## 2014-02-08 LAB — URINE MICROSCOPIC-ADD ON

## 2014-02-08 LAB — CBC
HCT: 40 % (ref 39.0–52.0)
HEMOGLOBIN: 13.3 g/dL (ref 13.0–17.0)
MCH: 31.2 pg (ref 26.0–34.0)
MCHC: 33.3 g/dL (ref 30.0–36.0)
MCV: 93.9 fL (ref 78.0–100.0)
Platelets: 153 10*3/uL (ref 150–400)
RBC: 4.26 MIL/uL (ref 4.22–5.81)
RDW: 14.5 % (ref 11.5–15.5)
WBC: 7.7 10*3/uL (ref 4.0–10.5)

## 2014-02-08 LAB — GLUCOSE, CAPILLARY: Glucose-Capillary: 273 mg/dL — ABNORMAL HIGH (ref 70–99)

## 2014-02-08 LAB — TROPONIN I

## 2014-02-08 LAB — CBG MONITORING, ED
GLUCOSE-CAPILLARY: 495 mg/dL — AB (ref 70–99)
Glucose-Capillary: >600 mg/dL (ref 70–99)
Glucose-Capillary: 408 mg/dL — ABNORMAL HIGH (ref 70–99)

## 2014-02-08 MED ORDER — INSULIN ASPART 100 UNIT/ML ~~LOC~~ SOLN
10.0000 [IU] | Freq: Once | SUBCUTANEOUS | Status: AC
  Administered 2014-02-08: 10 [IU] via SUBCUTANEOUS
  Filled 2014-02-08: qty 1

## 2014-02-08 MED ORDER — ACETAMINOPHEN 325 MG PO TABS
650.0000 mg | ORAL_TABLET | Freq: Four times a day (QID) | ORAL | Status: DC | PRN
  Administered 2014-02-11: 650 mg via ORAL
  Filled 2014-02-08: qty 2

## 2014-02-08 MED ORDER — ADULT MULTIVITAMIN W/MINERALS CH
1.0000 | ORAL_TABLET | Freq: Every day | ORAL | Status: DC
  Administered 2014-02-08 – 2014-02-11 (×4): 1 via ORAL
  Filled 2014-02-08 (×4): qty 1

## 2014-02-08 MED ORDER — ENOXAPARIN SODIUM 40 MG/0.4ML ~~LOC~~ SOLN
40.0000 mg | SUBCUTANEOUS | Status: DC
  Administered 2014-02-08 – 2014-02-10 (×3): 40 mg via SUBCUTANEOUS
  Filled 2014-02-08 (×4): qty 0.4

## 2014-02-08 MED ORDER — ONDANSETRON HCL 4 MG PO TABS: 4.0000 mg | ORAL_TABLET | Freq: Four times a day (QID) | ORAL | Status: DC | PRN

## 2014-02-08 MED ORDER — ONDANSETRON HCL 4 MG/2ML IJ SOLN: 4.0000 mg | Freq: Four times a day (QID) | INTRAMUSCULAR | Status: DC | PRN

## 2014-02-08 MED ORDER — HYDRALAZINE HCL 20 MG/ML IJ SOLN
10.0000 mg | Freq: Three times a day (TID) | INTRAMUSCULAR | Status: DC | PRN
Start: 2014-02-08 — End: 2014-02-11
  Administered 2014-02-08 – 2014-02-11 (×4): 10 mg via INTRAVENOUS
  Filled 2014-02-08 (×4): qty 1

## 2014-02-08 MED ORDER — DOCUSATE SODIUM 100 MG PO CAPS
100.0000 mg | ORAL_CAPSULE | Freq: Two times a day (BID) | ORAL | Status: AC
  Administered 2014-02-08 – 2014-02-10 (×4): 100 mg via ORAL
  Filled 2014-02-08 (×5): qty 1

## 2014-02-08 MED ORDER — SODIUM CHLORIDE 0.9 % IV SOLN
INTRAVENOUS | Status: DC
  Administered 2014-02-08 – 2014-02-09 (×2): via INTRAVENOUS

## 2014-02-08 MED ORDER — ACETAMINOPHEN 650 MG RE SUPP
650.0000 mg | Freq: Four times a day (QID) | RECTAL | Status: DC | PRN
Start: 2014-02-08 — End: 2014-02-11

## 2014-02-08 MED ORDER — INSULIN ASPART 100 UNIT/ML ~~LOC~~ SOLN
4.0000 [IU] | Freq: Three times a day (TID) | SUBCUTANEOUS | Status: DC
  Administered 2014-02-09 (×3): 4 [IU] via SUBCUTANEOUS

## 2014-02-08 MED ORDER — ASPIRIN 325 MG PO TABS
325.0000 mg | ORAL_TABLET | Freq: Every day | ORAL | Status: DC
  Administered 2014-02-08 – 2014-02-11 (×4): 325 mg via ORAL
  Filled 2014-02-08 (×4): qty 1

## 2014-02-08 MED ORDER — SODIUM CHLORIDE 0.9 % IV BOLUS (SEPSIS)
1000.0000 mL | Freq: Once | INTRAVENOUS | Status: AC
  Administered 2014-02-08: 1000 mL via INTRAVENOUS

## 2014-02-08 MED ORDER — INSULIN GLARGINE 100 UNIT/ML ~~LOC~~ SOLN
15.0000 [IU] | Freq: Once | SUBCUTANEOUS | Status: AC
  Administered 2014-02-08: 15 [IU] via SUBCUTANEOUS
  Filled 2014-02-08: qty 0.15

## 2014-02-08 MED ORDER — CIPROFLOXACIN HCL 500 MG PO TABS
500.0000 mg | ORAL_TABLET | Freq: Two times a day (BID) | ORAL | Status: DC
  Administered 2014-02-08 – 2014-02-10 (×4): 500 mg via ORAL
  Filled 2014-02-08 (×6): qty 1

## 2014-02-08 MED ORDER — INSULIN GLARGINE 100 UNIT/ML ~~LOC~~ SOLN
40.0000 [IU] | Freq: Every day | SUBCUTANEOUS | Status: DC
  Administered 2014-02-09: 40 [IU] via SUBCUTANEOUS
  Filled 2014-02-08 (×2): qty 0.4

## 2014-02-08 MED ORDER — INSULIN ASPART 100 UNIT/ML ~~LOC~~ SOLN
0.0000 [IU] | Freq: Three times a day (TID) | SUBCUTANEOUS | Status: DC
  Administered 2014-02-09: 8 [IU] via SUBCUTANEOUS
  Administered 2014-02-09 (×2): 11 [IU] via SUBCUTANEOUS
  Administered 2014-02-10: 5 [IU] via SUBCUTANEOUS
  Administered 2014-02-10: 3 [IU] via SUBCUTANEOUS
  Administered 2014-02-10: 8 [IU] via SUBCUTANEOUS
  Administered 2014-02-11: 2 [IU] via SUBCUTANEOUS
  Administered 2014-02-11: 8 [IU] via SUBCUTANEOUS

## 2014-02-08 NOTE — Progress Notes (Signed)
ED CM consulted by Alvera Singh PA-C in ED, concerning patient having had 3 ED visits and 2 hospitalization for similar complaints in the past 6 months. Patient presents to [facility] ED via EMS with elevated BS. In ED CBG was found to be above 600. Met with patient at bedside, confirmed information.Patient resides at [facility] in [place]. Very pleasant elderly gentleman. He reports checking his BS but,  uncertain of how many times per day he checking. B/P also elevated 200/80, concerns about whether patient is taking medications appropriately. Patient is being admitted for OBS. Unit CM/CSW will continue to follow for disposition plan.

## 2014-02-08 NOTE — ED Notes (Signed)
Patient transported to X-ray 

## 2014-02-08 NOTE — ED Provider Notes (Signed)
  Face-to-face evaluation   History: He complains of high sugar. He, states he is eating correctly and taking his medicine. There was a period last week, when he had run out of his insulin.  Physical exam: Elderly, alert, cooperative. Eyes- lids are injected inferiorly. Small amount of yellow drainage. Mouth is moist. Abdomen soft and nontender.  Medical screening examination/treatment/procedure(s) were conducted as a shared visit with non-physician practitioner(s) and myself.  I personally evaluated the patient during the encounter   Richarda Blade, MD 02/08/14 2339

## 2014-02-08 NOTE — H&P (Signed)
[facility] History and Physical  [name] WEISGERBER Sr. YTK:[id number] DOB: 1935-01-24 DOA: 02/08/2014  Referring physician: ED PCP: Tamsen Roers, MD   Chief Complaint: Increase urinary frequency.   HPI: [name] MAZZUCA Sr. is a 79 y.o. male with PMH significant for hypertension, Diabetes, prior stroke who presents to ED complaining of increase urinary frequency, urgency, and elevated blood sugar. Patient' s blood sugar has been elevated for last 2 months. Worse over last week. He was seen at [facility] ED twice. Last time he was found to have UTI/ he was discharge on ciprofloxacin. His caregiver relates that patient received his lantus this morning. He is only on lantus and metformin. His PCP stop SSI. Patient lives independent living facility. Caregiver relates patient needs assistance with medications, his urinary incontinence is worse. She relates that patient needs more assistance. He denies chest pain, dyspnea, no focal weakness, no headaches.  Care giver relates that patient BP medications disappear on Sunday (metoprolol 25 tablets). She think he took it.   In the ED his blood pressure was found to be at 210/80, CBG at 600.   Review of Systems:  Negative, except as per HPI.   Past Medical History  Diagnosis Date  . Diabetes mellitus   . Hypertension   . Hyperlipidemia   . Arthritis   . Peripheral vascular disease   . Hernia of abdominal wall   . DVT (deep venous thrombosis)   . Cancer     prostate  . Stroke 1998    History of 2 CVA  two weeks apart  . Carotid artery disease   . Coronary artery disease    Past Surgical History  Procedure Laterality Date  . Prostate surgery      SEED implant  . Carotid endarterectomy  07-27-10    Right CEA  . Carotid endarterectomy  2003    Left CEA redo  . Carotid endarterectomy  1998    Left CEA  . Coronary artery bypass graft  2012    CABG x 3 vessel by Dr. Cyndia Bent  . Shoulder fusion surgery  1980's    for recurrent  dislocation  . Foot surgery    . Pr vein bypass graft,aorto-fem-pop  07/27/10  . Tooth extraction  July 2013   Social History:  reports that he quit smoking about 2 years ago. His smoking use included Cigarettes. He has a 12.5 pack-year smoking history. He quit smokeless tobacco use about 2 years ago. He reports that he does not drink alcohol or use illicit drugs.  Allergies  Allergen Reactions  . Darvon [Propoxyphene Hcl] Other (See Comments)    Severe sweating  . Crestor [Rosuvastatin Calcium] Nausea And Vomiting  . Meperidine Hcl Other (See Comments)    Passes out    Family History  Problem Relation Age of Onset  . Heart disease Father     Heart Disease before age 62  . Heart disease Brother   . Heart attack Brother   . Hypertension Mother   . Heart attack Mother      Prior to Admission medications   Medication Sig Start Date End Date Taking? Authorizing Provider  aspirin 325 MG tablet Take 325 mg by mouth daily.   Yes Historical Provider, MD  ciprofloxacin (CIPRO) 500 MG tablet Take 500 mg by mouth 2 (two) times daily. For 10 days starting 02/02/2014   Yes Historical Provider, MD  insulin aspart (NOVOLOG) 100 UNIT/ML injection Inject 6 Units into the skin daily. 6 units for any  reading over 150 10/12/13  Yes Bonnielee Haff, MD  insulin glargine (LANTUS) 100 UNIT/ML injection Inject 40 Units into the skin at bedtime.  10/12/13  Yes Bonnielee Haff, MD  metFORMIN (GLUCOPHAGE) 500 MG tablet Take 500 mg by mouth 2 (two) times daily.  11/11/13  Yes Historical Provider, MD  metoprolol tartrate (LOPRESSOR) 25 MG tablet Take 25 mg by mouth 2 (two) times daily.    Yes Historical Provider, MD  Multiple Vitamin (MULTIVITAMIN) tablet Take 1 tablet by mouth daily.   Yes Historical Provider, MD   Physical Exam: Filed Vitals:   02/08/14 1730 02/08/14 1738 02/08/14 1745 02/08/14 1800  BP: 188/72 188/72 222/80 210/71  Pulse: 78 84 82 84  Temp:      TempSrc:      Resp:      SpO2: 97% 99% 100%  99%    Wt Readings from Last 3 Encounters:  01/08/14 85.276 kg (188 lb)  11/27/13 84.913 kg (187 lb 3.2 oz)  10/08/13 76.25 kg (168 lb 1.6 oz)    General:  Appears calm and comfortable Eyes: PERRL, normal lids, irises & conjunctiva ENT: grossly normal hearing, lips & tongue Neck: no LAD, masses or thyromegaly Cardiovascular: RRR, no m/r/g. No LE edema. Telemetry: SR, no arrhythmias  Respiratory: CTA bilaterally, no w/r/r. Normal respiratory effort. Abdomen: soft, ntnd Skin: no rash or induration seen on limited exam Musculoskeletal: grossly normal tone BUE/BLE Psychiatric: grossly normal mood and affect, speech fluent and appropriate Neurologic: grossly non-focal.          Labs on Admission:  Basic Metabolic Panel:  Recent Labs Lab 02/08/14 1448  NA 134*  K 4.9  CL 94*  CO2 26  GLUCOSE 612*  BUN 23  CREATININE 0.83  CALCIUM 9.5   Liver Function Tests:  Recent Labs Lab 02/08/14 1448  AST 16  ALT 16  ALKPHOS 148*  BILITOT 0.4  PROT 6.5  ALBUMIN 3.6   No results found for this basename: LIPASE, AMYLASE,  in the last 168 hours No results found for this basename: AMMONIA,  in the last 168 hours CBC:  Recent Labs Lab 02/08/14 1448  WBC 7.7  HGB 13.3  HCT 40.0  MCV 93.9  PLT 153   Cardiac Enzymes: No results found for this basename: CKTOTAL, CKMB, CKMBINDEX, TROPONINI,  in the last 168 hours  BNP (last 3 results) No results found for this basename: PROBNP,  in the last 8760 hours CBG:  Recent Labs Lab 02/08/14 1450 02/08/14 1649  GLUCAP >600* 495*    Radiological Exams on Admission: No results found.  EKG: will order EKG.    Assessment/Plan Active Problems:   Hyperglycemia   Type 2 diabetes mellitus with hyperosmolarity without nonketotic hyperglycemic-hyperosmolar coma (nkhhc)  1-Hyperglycemia; Diabetes: uncontrolled.  Patient Blood sugar decrease from 600 to 400 with IV fluids. He just received 10 units of regular insulin. Will  continue with lantus 40 units am. I will give extra 15 units tonight. Will order SSI, meal coverage 4 units. Need diet education. He will probably need meals coverage at time of discharge. IV fluids. Will check chest x ray, cardiac enzymes. Hold metformin while inpatient. Check HB-A1c.   2-Hypertension: No evidence of metoprolol overdose. His BP is elevated. Will order PRN hydralazine. Will hold metoprolol  for now. Monitor on telemetry.   3-DVT prophylaxis; Lovenox.   4-History of stroke; continue with aspirin.   5-Social issue; need PT, OT, might benefit of assisting living SNF.    Code Status:  Patient wishes to be DNR.  DVT Prophylaxis:Lovenox.  Family Communication: Care discussed with patient and caregiver.  Disposition Plan: expect 2 to 3 days.   Time spent: 65 minutes.   Niel Hummer A [facility] Pager [phone]  **Disclaimer: This note may have been dictated with voice recognition software. Similar sounding words can inadvertently be transcribed and this note may contain transcription errors which may not have been corrected upon publication of note.**

## 2014-02-08 NOTE — ED Notes (Signed)
Ice water given to pt °

## 2014-02-08 NOTE — ED Notes (Signed)
Josh,PA and student at bedside

## 2014-02-08 NOTE — ED Provider Notes (Signed)
CSN: [id number]     Arrival date & time 02/08/14  1429 History   First MD Initiated Contact with Patient 02/08/14 1636     Chief Complaint  Patient presents with  . Hyperglycemia     (Consider location/radiation/quality/duration/timing/severity/associated sxs/prior Treatment) HPI Comments: Patient with history of insulin-dependent diabetes -- presents with hyperglycemia and hypertension. This is the patient's third visit to an emergency department in the past one week for elevated blood sugar. Blood sugar currently in the 600s. Patient lives in a senior living facility. He gets some help with taking medications however there is question as to his compliance. Patient was diagnosed with UTI and is on Cipro during ED visit last week. Patient is in his usual state of health today. He denies fever, URI symptoms, cough, chest pain, shortness of breath, abdominal pain, dysuria, nausea, vomiting, diarrhea, skin rashes. No recent falls or trauma. Caregiver at bedside is concerned that patient does not have the level of care that he needs. Patient is supposed to be on metoprolol but these medications are currently missing. Caregiver is concerned patient took too many of these. He has not been on his blood pressure medication for several days. The onset of this condition was acute. The course is constant. Aggravating factors: none. Alleviating factors: none.    Patient is a 79 y.o. male presenting with hyperglycemia. The history is provided by the patient and a caregiver.  Hyperglycemia Associated symptoms: no abdominal pain, no chest pain, no dysuria, no fever, no nausea and no vomiting     Past Medical History  Diagnosis Date  . Diabetes mellitus   . Hypertension   . Hyperlipidemia   . Arthritis   . Peripheral vascular disease   . Hernia of abdominal wall   . DVT (deep venous thrombosis)   . Cancer     prostate  . Stroke 1998    History of 2 CVA  two weeks apart  . Carotid artery disease   .  Coronary artery disease    Past Surgical History  Procedure Laterality Date  . Prostate surgery      SEED implant  . Carotid endarterectomy  07-27-10    Right CEA  . Carotid endarterectomy  2003    Left CEA redo  . Carotid endarterectomy  1998    Left CEA  . Coronary artery bypass graft  2012    CABG x 3 vessel by Dr. Cyndia Bent  . Shoulder fusion surgery  1980's    for recurrent dislocation  . Foot surgery    . Pr vein bypass graft,aorto-fem-pop  07/27/10  . Tooth extraction  July 2013   Family History  Problem Relation Age of Onset  . Heart disease Father     Heart Disease before age 33  . Heart disease Brother   . Heart attack Brother   . Hypertension Mother   . Heart attack Mother    History  Substance Use Topics  . Smoking status: Former Smoker -- 0.25 packs/day for 50 years    Types: Cigarettes    Quit date: 11/28/2011  . Smokeless tobacco: Former Systems developer    Quit date: 06/27/2011  . Alcohol Use: No    Review of Systems  Constitutional: Negative for fever.  HENT: Negative for rhinorrhea and sore throat.   Eyes: Negative for redness.  Respiratory: Negative for cough.   Cardiovascular: Negative for chest pain.  Gastrointestinal: Negative for nausea, vomiting, abdominal pain and diarrhea.  Genitourinary: Negative for dysuria.  Musculoskeletal: Negative  for myalgias.  Skin: Negative for rash.  Neurological: Negative for headaches.      Allergies  Darvon; Crestor; and Meperidine hcl  Home Medications   Prior to Admission medications   Medication Sig Start Date End Date Taking? Authorizing Provider  aspirin 325 MG tablet Take 325 mg by mouth daily.   Yes Historical Provider, MD  insulin aspart (NOVOLOG) 100 UNIT/ML injection Inject 6 Units into the skin daily. 6 units for any reading over 150 10/12/13  Yes Bonnielee Haff, MD  insulin glargine (LANTUS) 100 UNIT/ML injection Inject 40 Units into the skin at bedtime.  10/12/13  Yes Bonnielee Haff, MD  metFORMIN  (GLUCOPHAGE) 500 MG tablet Take 1 tablet by mouth 2 (two) times daily. 11/11/13  Yes Historical Provider, MD  metoprolol tartrate (LOPRESSOR) 25 MG tablet Take 37.5 mg by mouth 2 (two) times daily.    Yes Historical Provider, MD  Multiple Vitamin (MULTIVITAMIN) tablet Take 1 tablet by mouth daily.   Yes Historical Provider, MD   BP 200/65  Pulse 76  Temp(Src) 97.9 F (36.6 C) (Oral)  Resp 20  SpO2 94%  Physical Exam  Nursing note and vitals reviewed. Constitutional: He appears well-developed and well-nourished.  HENT:  Head: Normocephalic and atraumatic.  Eyes: Conjunctivae are normal. Right eye exhibits no discharge. Left eye exhibits no discharge.  Neck: Normal range of motion. Neck supple.  Cardiovascular: Normal rate, regular rhythm and normal heart sounds.   Pulmonary/Chest: Effort normal and breath sounds normal.  Abdominal: Soft. There is no tenderness.  Neurological: He is alert.  Skin: Skin is warm and dry.  Psychiatric: He has a normal mood and affect.    ED Course  Procedures (including critical care time) Labs Review Labs Reviewed  COMPREHENSIVE METABOLIC PANEL - Abnormal; Notable for the following:    Sodium 134 (*)    Chloride 94 (*)    Glucose, Bld 612 (*)    Alkaline Phosphatase 148 (*)    GFR calc non Af Amer 83 (*)    All other components within normal limits  CBG MONITORING, ED - Abnormal; Notable for the following:    Glucose-Capillary >600 (*)    All other components within normal limits  CBG MONITORING, ED - Abnormal; Notable for the following:    Glucose-Capillary 495 (*)    All other components within normal limits  CBG MONITORING, ED - Abnormal; Notable for the following:    Glucose-Capillary 408 (*)    All other components within normal limits  URINE CULTURE  CBC  URINALYSIS, ROUTINE W REFLEX MICROSCOPIC  HEMOGLOBIN A1C  URINALYSIS, ROUTINE W REFLEX MICROSCOPIC  TROPONIN I  TROPONIN I  TROPONIN I  BASIC METABOLIC PANEL  CBC    Imaging  Review Dg Chest 2 View  02/08/2014   CLINICAL DATA:  Hyperglycemia.  Diabetes.  Hypertension.  EXAM: CHEST  2 VIEW  COMPARISON:  02/01/2014  FINDINGS: Prior CABG. Atherosclerotic aortic arch. Stable slightly lobular left hemidiaphragm. Degenerative glenohumeral arthropathy on the left with a left-sided scapular screw.  Thoracic spondylosis.  No pleural effusion identified.  IMPRESSION: 1. Prior CABG, without cardiomegaly or edema. 2. Overall stable appearance of the chest without acute findings.   Electronically Signed   By: Sherryl Barters M.D.   On: 02/08/2014 19:06     EKG Interpretation None      5:21 PM Patient seen and examined. Work-up initiated. Medications ordered.   Vital signs reviewed and are as follows: BP 200/65  Pulse 76  Temp(Src)  97.9 F (36.6 C) (Oral)  Resp 20  SpO2 94%  5:49 PM Patient discussed with Dr. Eulis Foster who has seen patient.   Spoke with Dr. Tyrell Antonio who will admit for an observation admission. Patient currently at [facility] in [place]. He has independently contracted home health aide who is with him today. Will get care manager involved to help with level of care issues.    MDM   Final diagnoses:  Hyperglycemia   Patient with uncontrolled hyperglycemia. This is his 3rd ED visit for this in past 7 days. He likely needs higher level of care. He is very high risk for additional visits for same problem if we do not address level of care and social issues today. Will obtain help from case manager/social work to address this situation.     Carlisle Cater, PA-C 02/08/14 2035

## 2014-02-08 NOTE — Progress Notes (Signed)
Report called and received from ED. 

## 2014-02-08 NOTE — ED Notes (Signed)
Pt is here with caregiver from CNA in home service; reports high blood sugar for several weeks; pt lives at independent living facility; staff member requesting pt be evaluated for assisted living; pt reports there has been a mix up with his meds at the pharmacy and he took insulin last night. Pt denies pain, is calm and cooperative. Urine smell noted.

## 2014-02-08 NOTE — ED Notes (Signed)
Dr. Wentz at bedside. 

## 2014-02-09 DIAGNOSIS — I10 Essential (primary) hypertension: Secondary | ICD-10-CM

## 2014-02-09 DIAGNOSIS — Z9181 History of falling: Secondary | ICD-10-CM

## 2014-02-09 DIAGNOSIS — I5032 Chronic diastolic (congestive) heart failure: Secondary | ICD-10-CM

## 2014-02-09 LAB — BASIC METABOLIC PANEL
Anion gap: 13 (ref 5–15)
BUN: 13 mg/dL (ref 6–23)
CALCIUM: 8.9 mg/dL (ref 8.4–10.5)
CO2: 24 mEq/L (ref 19–32)
Chloride: 100 mEq/L (ref 96–112)
Creatinine, Ser: 0.57 mg/dL (ref 0.50–1.35)
Glucose, Bld: 242 mg/dL — ABNORMAL HIGH (ref 70–99)
POTASSIUM: 3.8 meq/L (ref 3.7–5.3)
Sodium: 137 mEq/L (ref 137–147)

## 2014-02-09 LAB — HEMOGLOBIN A1C
Hgb A1c MFr Bld: 11.7 % — ABNORMAL HIGH (ref <5.7)
MEAN PLASMA GLUCOSE: 289 mg/dL — AB (ref <117)

## 2014-02-09 LAB — CBC
HCT: 45.8 % (ref 39.0–52.0)
Hemoglobin: 15.3 g/dL (ref 13.0–17.0)
MCH: 31.4 pg (ref 26.0–34.0)
MCHC: 33.4 g/dL (ref 30.0–36.0)
MCV: 94 fL (ref 78.0–100.0)
PLATELETS: 115 10*3/uL — AB (ref 150–400)
RBC: 4.87 MIL/uL (ref 4.22–5.81)
RDW: 14.3 % (ref 11.5–15.5)
WBC: 7.3 10*3/uL (ref 4.0–10.5)

## 2014-02-09 LAB — TROPONIN I: Troponin I: <0.3 ng/mL (ref <0.30)

## 2014-02-09 LAB — GLUCOSE, CAPILLARY
GLUCOSE-CAPILLARY: 330 mg/dL — AB (ref 70–99)
GLUCOSE-CAPILLARY: 300 mg/dL — AB (ref 70–99)
Glucose-Capillary: 265 mg/dL — ABNORMAL HIGH (ref 70–99)
Glucose-Capillary: 309 mg/dL — ABNORMAL HIGH (ref 70–99)

## 2014-02-09 LAB — PRO B NATRIURETIC PEPTIDE: PRO B NATRI PEPTIDE: 169.8 pg/mL (ref 0–450)

## 2014-02-09 NOTE — Progress Notes (Signed)
UR Completed.  Jalynn Betzold Jane [phone] 02/09/2014  

## 2014-02-09 NOTE — Progress Notes (Signed)
Inpatient Diabetes Program Recommendations  AACE/ADA: New Consensus Statement on Inpatient Glycemic Control (2013)  Target Ranges:  Prepandial:   less than 140 mg/dL      Peak postprandial:   less than 180 mg/dL (1-2 hours)      Critically ill patients:  140 - 180 mg/dL     Results for [name] [name], [name] [name]. (MRN [id number]) as of 02/09/2014 14:59  Ref. Range 02/09/2014 08:00 02/09/2014 11:42  Glucose-Capillary Latest Range: 70-99 mg/dL 265 (H) 309 (H)     Spoke with patient today about his DM self care regimen at home.  Patient told me he lives at [facility] but that his wife lives at their farm by herself.  Per care management note, patient stated he lives with his wife at [facility]  Not sure what his living situation is at this point.  Patient told me he takes his Lantus everyday in the AM and his Metformin bid.  Stopped taking Novolog 6 units QAM b/c he ran out of insulin and the pharmacy wouldn't fill it b/c he didn't have any refills.  Asked patient if he called his MD for a refill, but patient told me he did not.  Per patient, he checks his CBGs sporadically and does not keep a record.  Discussed with patient the two different insulins he is getting here in hospital (Lantus and Novolog).  Explained to patient how both Lantus and Novolog work and explained to patient that the MD will likely send him home on Novolog tid with meals.  Encouraged patient to check his CBGs tid at home and to keep a record for his MD.  Also explained to patient that if he goes home on a set dose of Novolog with meals that he should reduce his dose of Novolog if he ever has a day where he cannot eat much.  Explained to patient that a set dose of Novolog would likely be for meal coverage.   Will follow Wyn Quaker RN, MSN, CDE Diabetes Coordinator Inpatient Diabetes Program Team Pager: [phone] (8a-10p)

## 2014-02-09 NOTE — Evaluation (Signed)
Physical Therapy Evaluation Patient Details Name: [name] [name] Sr. MRN: [id number] DOB: 1934-09-18 Today's Date: 02/09/2014   History of Present Illness  Patient is a 79 y/o male admitted with hyperglycemia and increased urine frequency. His blood sugar has been elevated for last 2 months. Worse over last week. He was seen at [facility] ED twice. Caregiver relates patient needs assistance with medications, his urinary incontinence is worse. She relates that patient needs more assistance.Care giver relates that patient's BP medications disappeared on Sunday (metoprolol 25 tablets). She think he took it. In the ED his blood pressure was found to be at 210/80 and CBG at 600. PMH positive for HTN, DM, CVA, PVD, DVT and CAD.   Clinical Impression  Patient presents with functional limitations due to deficits listed in PT problem list (see below). Pt with impaired cognition (safety awareness/judgment, impulsiveness) and balance deficits limiting safe mobility and putting pt at increased risk for falls. Difficult to obtain accurate PLOF/history as pt poor historian and inconsistencies noted during reported history and info from caregiver. Pt not safe to be home alone. Pt unwilling to return to ALF or any rehab facility but would benefit from more supervision/assist at discharge. Pt would benefit from skilled PT in acute setting and follow up at [facility] SNF to improve safe mobility and maximize independence prior to discharge home.    Follow Up Recommendations SNF;Supervision/Assistance - 24 hour;Home health PT (Pt refusing any placement besides going home. Will need supervision if disposition is home.)    Equipment Recommendations  None recommended by PT    Recommendations for Other Services       Precautions / Restrictions Precautions Precautions: Fall Restrictions Weight Bearing Restrictions: No      Mobility  Bed Mobility Overal bed mobility: Needs Assistance Bed Mobility: Supine to Sit      Supine to sit: Supervision;HOB elevated     General bed mobility comments: Increased time. Use of rails for assist.  Transfers Overall transfer level: Needs assistance Equipment used: Rolling walker (2 wheeled) Transfers: Sit to/from [facility] Sit to Stand: Min assist;Min guard         General transfer comment: Stood from [employer] guard for safety as pt impulsive, stood from toilet x1 with grab bar and Min A due to low surface. Ambulated into bathroom and transferred on/off toilet, ambulated to chair. Incontinent of urine on floor prior to sitting on toilet.  Ambulation/Gait Ambulation/Gait assistance: Min guard Ambulation Distance (Feet): 175 Feet (15' +15' with seated rest breaks.) Assistive device: Rolling walker (2 wheeled) Gait Pattern/deviations: Step-through pattern;Decreased stride length;Decreased stance time - right;Trunk flexed Gait velocity: 1.3 ft/sec Gait velocity interpretation: Below normal speed for age/gender General Gait Details: Increased knee flexion RLE during stance phase of gait. Unsteady requiring Min guard for safery. Difficulty with turns. Sa02 decreased to 85% on RA post gait training. Resolved within 1 minute with VC for pursed lip breathing. Pt with no subjective symptoms.  Stairs            Wheelchair Mobility    Modified Rankin (Stroke Patients Only)       Balance Overall balance assessment: Needs assistance Sitting-balance support: No upper extremity supported;Feet unsupported Sitting balance-Leahy Scale: Good       Standing balance-Leahy Scale: Poor Standing balance comment: Able to stand while performing grooming/hand washing without UE support, requires UE support for longer distance ambulation for safety.  Pertinent Vitals/Pain Pain Assessment: No/denies pain    Home Living Family/patient expects to be discharged to:: Private residence Living Arrangements:  Spouse/significant other Available Help at Discharge: Family;Available PRN/intermittently Type of Home: House Home Access: Stairs to enter Entrance Stairs-Rails: Right Entrance Stairs-Number of Steps: "I think there are some steps" 4? Home Layout: Multi-level;Able to live on main level with bedroom/bathroom Home Equipment: Gilford Rile - 2 wheels;Cane - single point;Wheelchair - manual      Prior Function Level of Independence: Independent with assistive device(s)         Comments: Pt poor historian. Reports living in assisted living facility for the last 2 months and wants to go home. Estranged from wife and children, "they are worthless and just want my insurance money." Per MD notes, pt has caregiver that assists with IADLs - medication management and some ADLs however pt declines having any help at home. Difficult to obtain accurate history as there are inconsistencies.     Hand Dominance        Extremity/Trunk Assessment   Upper Extremity Assessment: Defer to OT evaluation           Lower Extremity Assessment: RLE deficits/detail;LLE deficits/detail;Generalized weakness RLE Deficits / Details: Reports pins and needles in bilateral feet and into mid shin. LLE Deficits / Details: Reports pins and needles in bilateral feet and into mid shin.     Communication   Communication: No difficulties  Cognition Arousal/Alertness: Awake/alert Behavior During Therapy: Impulsive Overall Cognitive Status: No family/caregiver present to determine baseline cognitive functioning Area of Impairment: Safety/judgement;Orientation Orientation Level: Disoriented to;Situation ("not sure why I am here" "That nice lady brought me cause my blood sugars")       Safety/Judgement: Decreased awareness of safety;Decreased awareness of deficits          General Comments      Exercises        Assessment/Plan    PT Assessment Patient needs continued PT services  PT Diagnosis Generalized  weakness;Difficulty walking   PT Problem List Decreased strength;Decreased cognition;Impaired sensation;Decreased activity tolerance;Decreased balance;Decreased safety awareness;Decreased mobility;Decreased knowledge of precautions  PT Treatment Interventions DME instruction;Balance training;Gait training;Stair training;Functional mobility training;Patient/family education;Therapeutic activities;Therapeutic exercise;Cognitive remediation   PT Goals (Current goals can be found in the Care Plan section) Acute Rehab PT Goals Patient Stated Goal: to go home PT Goal Formulation: With patient Time For Goal Achievement: 02/23/14 Potential to Achieve Goals: Fair    Frequency Min 3X/week   Barriers to discharge Decreased caregiver support      Co-evaluation               End of Session Equipment Utilized During Treatment: Gait belt Activity Tolerance: Patient tolerated treatment well Patient left: in chair;with call bell/phone within reach Nurse Communication: Mobility status;Precautions    Functional Assessment Tool Used: Clinical judgment Functional Limitation: Mobility: Walking and moving around Mobility: Walking and Moving Around Current Status [phone]): At least 1 percent but less than 20 percent impaired, limited or restricted Mobility: Walking and Moving Around Goal Status [phone]): At least 1 percent but less than 20 percent impaired, limited or restricted    Time: 1011-1046 PT Time Calculation (min): 35 min   Charges:   PT Evaluation $Initial PT Evaluation Tier I: 1 Procedure PT Treatments $Gait Training: 8-22 mins   PT G Codes:   Functional Assessment Tool Used: Clinical judgment Functional Limitation: Mobility: Walking and moving around    [place], [place] 02/09/2014, 11:04 AM Candy Sledge, PT, DPT [phone]

## 2014-02-09 NOTE — Progress Notes (Signed)
Inpatient Diabetes Program Recommendations  AACE/ADA: New Consensus Statement on Inpatient Glycemic Control (2013)  Target Ranges:  Prepandial:   less than 140 mg/dL      Peak postprandial:   less than 180 mg/dL (1-2 hours)      Critically ill patients:  140 - 180 mg/dL     Results for Pettigrew, Daqwan A SR. (MRN [id number]) as of 02/09/2014 08:39  Ref. Range 02/08/2014 14:50 02/08/2014 16:49 02/08/2014 18:27 02/08/2014 20:20  Glucose-Capillary Latest Range: 70-99 mg/dL >600 (HH) 495 (H) 408 (H) 273 (H)    Results for Stevison, Raffi A SR. (MRN [id number]) as of 02/09/2014 08:39  Ref. Range 10/08/2013 17:51 02/08/2014 20:21  Hemoglobin A1C Latest Range: <5.7 % 14.0 (H) 11.7 (H)     Admitted with Hyperglycemia.  Per MD notes, patient is supposed to be taking Lantus 40 units QHS + Metformin 500 mg bid at home.  Per notes, patient was taking Novolog SSI at home but his PCP stopped his SSI regimen a while ago.  Based on A1c of 11.7%, patient likely needs Novolog back in his home insulin regimen. Note that patient is currently living in an independent living facility, however, he may need a higher level of care at this point.  Have placed RD consult for DM diet education.  DM Coordinator to see pt today as well to explore his DM self care habits at home.  Note patient to start on Lantus 40 units daily today along with Novolog SSI and 4 units Novolog Meal Coverage.  Agree.    Will follow Wyn Quaker RN, MSN, CDE Diabetes Coordinator Inpatient Diabetes Program Team Pager: [phone] (8a-10p)

## 2014-02-09 NOTE — Evaluation (Signed)
Occupational Therapy Evaluation Patient Details Name: [name] [name] Sr. MRN: [id number] DOB: Jun 10, 1935 Today's Date: 02/09/2014    History of Present Illness Patient is a 79 y/o male admitted with hyperglycemia and increased urine frequency. His blood sugar has been elevated for last 2 months. Worse over last week. He was seen at [facility] ED twice.  Per H&P: Caregiver relates patient needs assistance with medications, his urinary incontinence is worse. She relates that patient needs more assistance.Care giver relates that patient's BP medications disappeared on Sunday (metoprolol 25 tablets). She think he took it. In the ED his blood pressure was found to be at 210/80 and CBG at 600. PMH positive for HTN, DM, CVA, PVD, DVT and CAD.   Clinical Impression   Patient admitted from independent living facility: [facility] and reports that he has been there for ~5-6 weeks, "my son put me there but [place]".  Unsure of patients prior level of function as he reports that someone has been coming in when he showers and washes his bath and someone else does his wash and gets his medication but he states "I don't trust them".  Has told his son who has POA however reports "they are friends with my son so he does not do anything about it".  Patient with inconsistent answers to questions and family not present.  Recommend that patient continue with more therapy before discharge home and if he does discharge back home, recommend 24 hr supervision due to cognition and blood sugar issues.    Follow Up Recommendations  Supervision/Assistance - 24 hour;Home health OT (back to [place] )    Equipment Recommendations  None recommended by OT    Precautions / Restrictions Precautions Precautions: Fall Restrictions Weight Bearing Restrictions: No      Mobility Bed Mobility Overal bed mobility: Needs Assistance Bed Mobility: Supine to Sit     Supine to sit:  Supervision        Transfers Overall transfer level: Needs assistance Equipment used: Rolling walker (2 wheeled)   Sit to Stand: Min guard         General transfer comment: Stood from [employer] guard for safety as pt impulsive, stood from toilet x1 with grab bar due to low surface. Ambulated into bathroom and transferred on/off toilet, ambulated to chair.    Balance     Sitting balance-Leahy Scale: Good       Standing balance-Leahy Scale: Poor         ADL Overall ADL's : Needs assistance/impaired     Grooming: Wash/dry hands;Supervision/safety;Standing      Toilet Transfer: Supervision/safety;Grab bars;Regular Toilet;RW   Toileting- Clothing Manipulation and Hygiene: Supervision/safety;Sit to/from stand         General ADL Comments: Patient supervision for simple BADL tasks and declined to perform grooming tasks at sink except wash his hands.  Supervision provided secondary to walker safety, decreased cognition and h/o blood sugar issues resulting in "blacking out in the past" per patient.      Pertinent Vitals/Pain Pain Assessment: No/denies pain     Hand Dominance     Extremity/Trunk Assessment Upper Extremity Assessment Upper Extremity Assessment: Generalized weakness   Lower Extremity Assessment Lower Extremity Assessment: Defer to PT evaluation       Communication Communication Communication: No difficulties   Cognition Arousal/Alertness: Awake/alert Behavior During Therapy: Impulsive Overall Cognitive Status: No family/caregiver present to determine baseline cognitive functioning Area of Impairment: Safety/judgement Orientation  Level: Situation       Safety/Judgement: Decreased awareness of safety;Decreased awareness of deficits           Home Living Family/patient expects to be discharged to:: Other (Comment) (PTA: [place], wants to go back home) Living Arrangements: Other (Comment) (private  caregiver-unsure of hours and duties) Available Help at Discharge: Available PRN/intermittently;Personal care attendant Type of Home: Independent living facility   Entrance Stairs-Number of Steps: "I think there are some steps" 4? (at his home) Entrance Stairs-Rails: Right (at his home)    Home Equipment: Walker - 2 wheels;Cane - single point;Wheelchair - manual          Prior Functioning/Environment Level of Independence: Independent with assistive device(s);Needs assistance    ADL's / Homemaking Assistance Needed: "A lady comes when I take a shower and washes my back, I'm not sure why".   Comments: Pt poor historian. Reports living in assisted living facility for the last 2 months and wants to go home. Appears estranged from wife and children, "they are worthless and just want my insurance money." "I don't trust the people that have been hired to help me at [facility], I told my son but they are his friends so he does not think anything is wrong", "my wife does not want me at home so I think there is something going on there"Per MD notes, pt has caregiver that assists with IADLs - medication management and some ADLs.  Difficult to obtain accurate history as there are inconsistencies.    OT Diagnosis: Generalized weakness;Cognitive deficits   OT Problem List: Decreased strength;Impaired balance (sitting and/or standing);Decreased safety awareness;Decreased cognition   OT Treatment/Interventions: Self-care/ADL training;DME and/or AE instruction;Cognitive remediation/compensation;Therapeutic activities;Balance training;Patient/family education    OT Goals(Current goals can be found in the care plan section) Acute Rehab OT Goals Patient Stated Goal: to go home OT Goal Formulation: With patient Potential to Achieve Goals: Good  OT Frequency: Min 2X/week   Barriers to D/C: Other (comment)  Unsure if wife is willing or able to provide supervision if patient discharges back home.  No  family present upon eval and unsure if patient is good historian.       End of Session Equipment Utilized During Treatment: Gait belt;Rolling walker  Activity Tolerance: Patient tolerated treatment well Patient left: in chair;with nursing/sitter in room   Time: 3329-5188 OT Time Calculation (min): 39 min Charges:  OT General Charges $OT Visit: 1 Procedure OT Evaluation $Initial OT Evaluation Tier I: 1 Procedure OT Treatments $Self Care/Home Management : 23-37 mins G-Codes: OT G-codes **NOT FOR INPATIENT CLASS** Functional Assessment Tool Used: Clinical Assessment Functional Limitation: Self care Self Care Current Status (C1660): At least 1 percent but less than 20 percent impaired, limited or restricted Self Care Goal Status (Y3016): At least 1 percent but less than 20 percent impaired, limited or restricted  [place], [place] 02/09/2014, 4:22 PM

## 2014-02-09 NOTE — Progress Notes (Addendum)
PROGRESS NOTE  [name] [name]. XNA:[id number] DOB: [birth year] DOA: 02/08/2014 PCP: Tamsen Roers, MD  HPI/Recap of past 57 hours: 79 year old white male from independent living with history of hypertension and diabetes admitted on 8/10 with marked hyperglycemia CBGs greater than 600 as well as malignant hypertension with systolic greater than 542.  Assessment/Plan: Active Problems:   HYPERLIPIDEMIA: Stable. Continue on statin.  Chronic diastolic heart failure: Checking BNP. Concerned about patient's compliance    HYPERTENSION: Pressures better stabilized with IV hydralazine    CAD: Stable. Troponin level normal.    Frequent falls: Suspect multifactorial which could include elevated blood sugars, elevated blood pressure, CHF and overall deconditioning. Physical therapy to evaluate.    Tobacco abuse: Counseled patient. He declined nicotine patch.    Type 2 diabetes mellitus with hyperosmolarity without nonketotic hyperglycemic-hyperosmolar coma (nkhhc): A1c at 11.9, despite patient's assurances that his blood sugars are normal at home. Home dose of Lantus started this morning, so expect sugars to be elevated throughout today. We'll reassess by tomorrow. If they are better controlled, further evidence he has not been compliant with his medication regimen  Recent UTI: Completing this course of oral Cipro   Code Status: DO NOT RESUSCITATE  Family Communication: Left message with son  Disposition Plan: Patient from it independent living. He will certainly need higher level of care   Consultants:  None  Procedures:  None  Antibiotics:  Cipro started 8/4-8/12  HPI/Subjective: Patient doing okay. He is feeling better. Stronger. He assures me that he's been taking his medications.  Objective: BP 133/50  Pulse 93  Temp(Src) 98.6 F (37 C) (Oral)  Resp 16  Ht 5\' 9"  (1.753 m)  Wt 82.4 kg (181 lb 10.5 oz)  BMI 26.81 kg/m2  SpO2 97%  Intake/Output Summary (Last 24  hours) at 02/09/14 1640 Last data filed at 02/09/14 0353  Gross per 24 hour  Intake      0 ml  Output    775 ml  Net   -775 ml   Filed Weights   02/08/14 2012  Weight: 82.4 kg (181 lb 10.5 oz)    Exam:   General:  Alert and oriented x2, no acute distress  Cardiovascular: Regular rate and rhythm, H0-W2, 2/6 systolic ejection murmur  Respiratory: Clear auscultation bilaterally  Abdomen: Soft, nontender, nondistended, positive bowel sounds  Musculoskeletal: No clubbing or cyanosis, trace pitting edema bilaterally   Data Reviewed: Basic Metabolic Panel:  Recent Labs Lab 02/08/14 1448 02/09/14 0630  NA 134* 137  K 4.9 3.8  CL 94* 100  CO2 26 24  GLUCOSE 612* 242*  BUN 23 13  CREATININE 0.83 0.57  CALCIUM 9.5 8.9   Liver Function Tests:  Recent Labs Lab 02/08/14 1448  AST 16  ALT 16  ALKPHOS 148*  BILITOT 0.4  PROT 6.5  ALBUMIN 3.6   No results found for this basename: LIPASE, AMYLASE,  in the last 168 hours No results found for this basename: AMMONIA,  in the last 168 hours CBC:  Recent Labs Lab 02/08/14 1448 02/09/14 0630  WBC 7.7 7.3  HGB 13.3 15.3  HCT 40.0 45.8  MCV 93.9 94.0  PLT 153 115*   Cardiac Enzymes:    Recent Labs Lab 02/08/14 2021 02/09/14 0052 02/09/14 0630  TROPONINI <0.30 <0.30 <0.30   BNP (last 3 results) No results found for this basename: PROBNP,  in the last 8760 hours CBG:  Recent Labs Lab 02/08/14 1649 02/08/14 1827 02/08/14 2020 02/09/14 0800 02/09/14 1142  GLUCAP 495* 408* 273* 265* 309*    No results found for this or any previous visit (from the past 240 hour(s)).   Studies: Dg Chest 2 View  02/08/2014   CLINICAL DATA:  Hyperglycemia.  Diabetes.  Hypertension.  EXAM: CHEST  2 VIEW  COMPARISON:  02/01/2014  FINDINGS: Prior CABG. Atherosclerotic aortic arch. Stable slightly lobular left hemidiaphragm. Degenerative glenohumeral arthropathy on the left with a left-sided scapular screw.  Thoracic  spondylosis.  No pleural effusion identified.  IMPRESSION: 1. Prior CABG, without cardiomegaly or edema. 2. Overall stable appearance of the chest without acute findings.   Electronically Signed   By: Sherryl Barters M.D.   On: 02/08/2014 19:06    Scheduled Meds: . aspirin  325 mg Oral Daily  . ciprofloxacin  500 mg Oral BID  . docusate sodium  100 mg Oral BID  . enoxaparin (LOVENOX) injection  40 mg Subcutaneous Q24H  . insulin aspart  0-15 Units Subcutaneous TID WC  . insulin aspart  4 Units Subcutaneous TID WC  . insulin glargine  40 Units Subcutaneous Daily  . multivitamin with minerals  1 tablet Oral Daily    Continuous Infusions: . sodium chloride 75 mL/hr at 02/09/14 0908     Time spent: 45 minutes  [facility] Pager [phone]. If 7PM-7AM, please contact night-coverage at [email or web address], password [facility] 02/09/2014, 4:40 PM  LOS: 1 day

## 2014-02-09 NOTE — Plan of Care (Addendum)
Problem: Food- and Nutrition-Related Knowledge Deficit (NB-1.1) Goal: Nutrition education Formal process to instruct or train a patient/client in a skill or to impart knowledge to help patients/clients voluntarily manage or modify food choices and eating behavior to maintain or improve health. Outcome: Completed/Met Date Met:  02/09/14  RD consulted for nutrition education regarding diabetes.  Pt with hx of DM. He reports that he has a sausage biscuit for breakfast, skips lunch, and usually has peanut butter and ritz crackers for dinner. Pt reports that his wife does not cook and does not like to do anything for him. Pt seems frustrated.  Encouraged pt try some frozen dinners to help him eat more complete meals.  Per notes pt may need higher level of care at d/c.   Pt with no recent weight loss PTA per pt.     Lab Results  Component Value Date    HGBA1C 11.7* 02/08/2014    RD provided "Plate Method" handout. Discussed different food groups and their effects on blood sugar, emphasizing carbohydrate-containing foods.   Discussed importance of controlled and consistent carbohydrate intake throughout the day. Provided examples of ways to balance meals/snacks. Teach back method used.  Expect fair compliance.  Body mass index is 26.81 kg/(m^2). Pt meets criteria for overweight based on current BMI.  Current diet order is CHO Modified, patient is consuming approximately 100% of meals at this time. Labs and medications reviewed. No further nutrition interventions warranted at this time. RD contact information provided. If additional nutrition issues arise, please re-consult RD.  [place], [place], [place] Pager [phone] After Hours Pager

## 2014-02-09 NOTE — Care Management Note (Signed)
    Page 1 of 2   02/11/2014     3:04:06 PM CARE MANAGEMENT NOTE 02/11/2014  Patient:  [name] [name],[name] [name]   Account Number:  [id number]  Date Initiated:  02/09/2014  Documentation initiated by:  Tomi Bamberger  Subjective/Objective Assessment:   dx hyperglycemia  admit- from [facility]- lives with spouse. Patient has rolling walker at home.     Action/Plan:   pt eval- rec snf vs 24 hr with hh.   Anticipated DC Date:  02/11/2014   Anticipated DC Plan:  [place]  In-house referral  Clinical Social Worker      DC Planning Services  CM consult      [facility] Choice  HOME HEALTH   Choice offered to / List presented to:          [facility] arranged  HH-1 RN  [place]      [place] agency  OTHER - SEE NOTE   Status of service:  Completed, signed off Medicare Important Message given?  YES (If response is "NO", the following Medicare IM given date fields will be blank) Date Medicare IM given:  02/10/2014 Medicare IM given by:  Tomi Bamberger Date Additional Medicare IM given:   Additional Medicare IM given by:    Discharge Disposition:  [place]  Per UR Regulation:  Reviewed for med. necessity/level of care/duration of stay  If discussed at [place] of Stay Meetings, dates discussed:    Comments:  02/11/14 [place], BSN [phone] patient is for dc today, Mitzi Hansen at the facility faxed CSW [name] Rapid Referral Form to have MD fill out. Patient will be receiving  pt, ot, RN, Aide and Education officer, museum.  NCM faxed this information to  [place] at [facility] for [place] , confirmed he received it and he stated that was all they needed and he will fax to [place].  NCM confirmed with [facility] that the referral was received.  Patient 's aide will be picking him up at 3:30 pm today.  02/10/14 [place], BSN [phone] referral to CSW.  CSW called [place] to facilitate placement needs.  02/09/14 [place], BSN [phone] patient states he lives with spouse, he has someone from helping hands , who comes 2-3 times [name] week to wash clothes and clean.    NCM called [facility] , spoke with Mitzi Hansen at [phone], he states patient has [name] care giver , Lattie Haw [phone] who comes in 3 times per day.  Mitzi Hansen states they have all different levels of care there at [facility].  He states they use [facility] [phone], fax [phone] for home health services which they prefer. Mitzi Hansen also states patient's care giver will transport him from hospital at dc when he is ready.  NCM spoke with patient and informed him about the information Mitzi Hansen gave me, NCM will continue to follow for dc needs.

## 2014-02-10 DIAGNOSIS — E1101 Type 2 diabetes mellitus with hyperosmolarity with coma: Secondary | ICD-10-CM

## 2014-02-10 LAB — URINE CULTURE
CULTURE: NO GROWTH
Colony Count: NO GROWTH

## 2014-02-10 LAB — GLUCOSE, CAPILLARY
Glucose-Capillary: 212 mg/dL — ABNORMAL HIGH (ref 70–99)
Glucose-Capillary: 300 mg/dL — ABNORMAL HIGH (ref 70–99)
Glucose-Capillary: 162 mg/dL — ABNORMAL HIGH (ref 70–99)
Glucose-Capillary: 167 mg/dL — ABNORMAL HIGH (ref 70–99)

## 2014-02-10 MED ORDER — INSULIN GLARGINE 100 UNIT/ML ~~LOC~~ SOLN
44.0000 [IU] | Freq: Every day | SUBCUTANEOUS | Status: DC
  Administered 2014-02-10 – 2014-02-11 (×2): 44 [IU] via SUBCUTANEOUS
  Filled 2014-02-10 (×3): qty 0.44

## 2014-02-10 MED ORDER — INSULIN ASPART 100 UNIT/ML ~~LOC~~ SOLN
6.0000 [IU] | Freq: Three times a day (TID) | SUBCUTANEOUS | Status: DC
  Administered 2014-02-10 – 2014-02-11 (×5): 6 [IU] via SUBCUTANEOUS

## 2014-02-10 MED ORDER — METOPROLOL TARTRATE 25 MG PO TABS
25.0000 mg | ORAL_TABLET | Freq: Two times a day (BID) | ORAL | Status: DC
  Administered 2014-02-10 – 2014-02-11 (×3): 25 mg via ORAL
  Filled 2014-02-10 (×4): qty 1

## 2014-02-10 NOTE — Progress Notes (Signed)
Physical Therapy Treatment Patient Details Name: [name] [name]. MRN: [id number] DOB: 07-28-1934 Today's Date: 02/10/2014    History of Present Illness Patient is a 79 y/o male admitted with hyperglycemia and increased urine frequency. His blood sugar has been elevated for last 2 months. Worse over last week. He was seen at [facility] ED twice.  Per H&P: Caregiver relates patient needs assistance with medications, his urinary incontinence is worse. She relates that patient needs more assistance.Care giver relates that patient's BP medications disappeared on Sunday (metoprolol 25 tablets). She think he took it. In the ED his blood pressure was found to be at 210/80 and CBG at 600. PMH positive for HTN, DM, CVA, PVD, DVT and CAD.    PT Comments    Patient in better spirits today and very cooperative and motivated to participate in therapy. Pt less impulsive today resulting in improved balance. Continues to exhibit cognitive deficits (poor safety awareness) and assist with transfers from low surfaces. Pt will need more supervision at discharge for safety. Will continue to follow and progress as tolerated. `  Follow Up Recommendations  Supervision/Assistance - 24 hour;Home health PT (Return to independent living with appropriate supervisio)     Equipment Recommendations  None recommended by PT    Recommendations for Other Services       Precautions / Restrictions Precautions Precautions: Fall Restrictions Weight Bearing Restrictions: No    Mobility  Bed Mobility Overal bed mobility: Needs Assistance Bed Mobility: Rolling;Sidelying to Sit;Sit to Sidelying Rolling: Modified independent (Device/Increase time) Sidelying to sit: Supervision     Sit to sidelying: Supervision General bed mobility comments: Rolling to left x2; Sidelying to sit x2; Increased time and multiple attempts to get to sitting position, HOB flat, no rails. VC for technique to prop up on  elbow.  Transfers Overall transfer level: Needs assistance Equipment used: Rolling walker (2 wheeled) Transfers: Sit to/from Stand Sit to Stand: Min guard Stand pivot transfers: Min guard       General transfer comment: Stood from EOB x3 with Min guard for safety. Min A to stand from toilet due to low height with use of grab bar.   Ambulation/Gait Ambulation/Gait assistance: Min guard Ambulation Distance (Feet): 175 Feet Assistive device: Rolling walker (2 wheeled) Gait Pattern/deviations: Step-through pattern;Trunk flexed;Decreased stance time - right Gait velocity: 2.1 ft/sec   General Gait Details: Increased knee flexion RLE during stance phase of gait. Unsteady requiring Min guard for safery. Pt less impulsive today which improved balance.   Stairs            Wheelchair Mobility    Modified Rankin (Stroke Patients Only)       Balance Overall balance assessment: Needs assistance   Sitting balance-Leahy Scale: Good Sitting balance - Comments: Performed changing of gowns x2 in seated position without difficulty. Able to perform pericare in bathroom without assist reaching outside BoS.       Standing balance comment: Able to perform dynamic standing (hand washing) without UE support; requires UE support, RW for ambulation for safety.                    Cognition Arousal/Alertness: Awake/alert Behavior During Therapy: WFL for tasks assessed/performed Overall Cognitive Status: Within Functional Limits for tasks assessed                      Exercises General Exercises - Lower Extremity Ankle Circles/Pumps: Both;10 reps;Seated Long Arc Quad: Both;10 reps;Seated Hip Flexion/Marching: Both;10 reps;Seated  General Comments        Pertinent Vitals/Pain Pain Assessment: No/denies pain    Home Living                      Prior Function            PT Goals (current goals can now be found in the care plan section) Progress  towards PT goals: Progressing toward goals    Frequency       PT Plan Discharge plan needs to be updated    Co-evaluation             End of Session Equipment Utilized During Treatment: Gait belt Activity Tolerance: Patient tolerated treatment well Patient left: in chair;with call bell/phone within reach     Time: 1226-1252 PT Time Calculation (min): 26 min  Charges:  $Gait Training: 8-22 mins $Therapeutic Exercise: 8-22 mins                    G CodesCandy Sledge A 03/07/14, 1:44 PM Candy Sledge, PT, DPT [phone]

## 2014-02-10 NOTE — Consult Note (Addendum)
WOC wound consult note Reason for Consult: Consult requested for buttocks.  Pt states he believes area developed from pulling up in bed frequently (shear injury) Wound type:Partial thickness wound; appears to be previous blistered area which has ruptured and peeled at this time. Measurement: .5X.5X.1cm Wound bed: pink and dry Drainage (amount, consistency, odor) no odor or drainage Periwound: small amt loose peeling skin Dressing procedure/placement/frequency: Foam dressing to protect and promote healing. Please re-consult if further assistance is needed.  Thank-you,  Julien Girt MSN, [place], [place], [place], [place]

## 2014-02-10 NOTE — Progress Notes (Signed)
UR Completed.  [facility] [phone] 02/10/2014

## 2014-02-10 NOTE — Progress Notes (Signed)
PROGRESS NOTE  [name] [name]. GNF:[id number] DOB: 1935/04/30 DOA: 02/08/2014 PCP: Tamsen Roers, MD  HPI/Recap of past 73 hours: 79 year old white male from independent living with history of hypertension and diabetes admitted on 8/10 with marked hyperglycemia CBGs greater than 600 as well as malignant hypertension with systolic greater than 846.  Assessment/Plan: Hyperosmolar nonketotic state in setting of DMII. A1C is 11.9 CBGs much better controlled inpatient. Patient is unable to manage his insulin at home. Have titrated insulin dosing-reassess in am  Malignant hypertension IV hydralazine utilized. Will restart home dose metoprolol and continue to monitor overnight to determine if outpatient regimen needs adjustment.  Frequent falls Suspect multifactorial which could include elevated blood sugars, elevated blood pressure, CHF and overall deconditioning. Physical therapy recommends 24 hour assistance which will require SNF.  Hyperlipidemia  Not on a statin per Dr. Kennon Holter note of 03/6294  Chronic diastolic heart failure:  BNP stable.   Normal LV function per Dr. Kennon Holter 12/2013 note. On beta blocker for irregular rhythm (scattered PVCs) Patient does not appear volume overloaded.  CAD: Stable. Troponin level normal.  Recent UTI:  Culture negative. Course of antibiotics completed. Cipro discontinued.  Pressure ulceration Appreciate WOC recs.  Code Status: DO NOT RESUSCITATE  Family Communication:   Disposition Plan: inpatient.  D/c to SNF.  likley 8/13.   Consultants:  None  Procedures:  None  Antibiotics:  Cipro started 8/4-8/12  HPI/Subjective: No complaints.  Aware that he is going to [facility] at discharge.  States he would prefer to go home.  Objective: BP 130/71  Pulse 88  Temp(Src) 97.9 F (36.6 C) (Oral)  Resp 16  Ht 5\' 9"  (1.753 m)  Wt 82.4 kg (181 lb 10.5 oz)  BMI 26.81 kg/m2  SpO2 95%  Intake/Output Summary (Last 24  hours) at 02/10/14 1454 Last data filed at 02/10/14 0856  Gross per 24 hour  Intake   1140 ml  Output    350 ml  Net    790 ml   Filed Weights   02/08/14 2012  Weight: 82.4 kg (181 lb 10.5 oz)    Exam:   General:  Alert and oriented x2, no acute distress.  Sitting up eating breakfast.  Cardiovascular: Regular rate and rhythm, M8-U1, 2/6 systolic ejection murmur  Respiratory: Clear auscultation bilaterally, no accessory muscle movement.  Abdomen: Soft, nontender, nondistended, positive bowel sounds  Musculoskeletal: No clubbing or cyanosis, trace pitting edema bilaterally   Data Reviewed: Basic Metabolic Panel:  Recent Labs Lab 02/08/14 1448 02/09/14 0630  NA 134* 137  K 4.9 3.8  CL 94* 100  CO2 26 24  GLUCOSE 612* 242*  BUN 23 13  CREATININE 0.83 0.57  CALCIUM 9.5 8.9   Liver Function Tests:  Recent Labs Lab 02/08/14 1448  AST 16  ALT 16  ALKPHOS 148*  BILITOT 0.4  PROT 6.5  ALBUMIN 3.6   CBC:  Recent Labs Lab 02/08/14 1448 02/09/14 0630  WBC 7.7 7.3  HGB 13.3 15.3  HCT 40.0 45.8  MCV 93.9 94.0  PLT 153 115*   Cardiac Enzymes:    Recent Labs Lab 02/08/14 2021 02/09/14 0052 02/09/14 0630  TROPONINI <0.30 <0.30 <0.30   BNP (last 3 results)  Recent Labs  02/09/14 1913  PROBNP 169.8   CBG:  Recent Labs Lab 02/09/14 1142 02/09/14 1652 02/09/14 2118 02/10/14 0800 02/10/14 1209  GLUCAP 309* 330* 300* 212* 300*    Recent Results (from the past 240 hour(s))  URINE CULTURE  Status: None   Collection Time    02/08/14  9:59 PM      Result Value Ref Range Status   Specimen Description URINE, RANDOM   Final   Special Requests NONE   Final   Culture  Setup Time     Final   Value: 02/08/2014 22:22     Performed at [facility]     Final   Value: NO GROWTH     Performed at [facility]   Culture     Final   Value: NO GROWTH     Performed at [facility]   Report Status 02/10/2014  FINAL   Final     Studies: No results found.  Scheduled Meds: . aspirin  325 mg Oral Daily  . docusate sodium  100 mg Oral BID  . enoxaparin (LOVENOX) injection  40 mg Subcutaneous Q24H  . insulin aspart  0-15 Units Subcutaneous TID WC  . insulin aspart  6 Units Subcutaneous TID WC  . insulin glargine  44 Units Subcutaneous Daily  . metoprolol tartrate  25 mg Oral BID  . multivitamin with minerals  1 tablet Oral Daily    Continuous Infusions:     Time spent: 45 minutes  Karen Kitchens  [facility] Pager [phone]. If 7PM-7AM, please contact night-coverage at [email or web address], password [facility] 02/10/2014, 2:54 PM  LOS: 2 days   Attending Patient was seen, examined,treatment plan was discussed with the Physician extender. I have directly reviewed the clinical findings, lab, imaging studies and management of this patient in detail. I have made the necessary changes to the above noted documentation, and agree with the documentation, as recorded by the Physician extender.  Nena Alexander MD [facility].

## 2014-02-10 NOTE — Clinical Social Work Psychosocial (Signed)
Clinical Social Work Department BRIEF PSYCHOSOCIAL ASSESSMENT 02/10/2014  Patient:  [name] [name],[name] [name]     Account Number:  [id number]     Admit date:  02/08/2014  Clinical Social Worker:  Lovey Newcomer  Date/Time:  02/10/2014 12:18 PM  Referred by:  Physician  Date Referred:  02/10/2014 Referred for  SNF Placement   Other Referral:   Interview type:  Patient Other interview type:   Patient alert and oriented at time of assessment.    PSYCHOSOCIAL DATA Living Status:  FACILITY Admitted from facility:  Other Level of care:  Independent Living Primary support name:  Chane Cowden Primary support relationship to patient:  CHILD, ADULT Degree of support available:   Support is fair.    CURRENT CONCERNS Current Concerns  Post-Acute Placement   Other Concerns:    SOCIAL WORK ASSESSMENT / PLAN CSW met with patient at bedside. Patient adamantly refused SNF placement but states that he will be agreeable to receiving the care he needs at [facility]. CSW has spoken with facility which states that they will be able to provide the care the patient needs at discharge. CSW will send referral to facility. Per Md patient will likely be ready for Dc tomorrow. Patient reports that he lives at [facility] independent living with his wife. Patient appears calm and states he is impressed with the care he has received here at [facility]. CSW will assist.   Assessment/plan status:  Psychosocial Support/Ongoing Assessment of Needs Other assessment/ plan:   Complete Fl2, Fax, PASRR   Information/referral to community resources:   CSW contact information given.    PATIENT'S/FAMILY'S RESPONSE TO PLAN OF CARE: Patient states that he will return to [facility] at discharge. CSW will follow up.       Liz Beach MSW, [place], [place], [phone]

## 2014-02-11 DIAGNOSIS — I160 Hypertensive urgency: Secondary | ICD-10-CM | POA: Diagnosis present

## 2014-02-11 DIAGNOSIS — I2510 Atherosclerotic heart disease of native coronary artery without angina pectoris: Secondary | ICD-10-CM

## 2014-02-11 LAB — GLUCOSE, CAPILLARY
Glucose-Capillary: 147 mg/dL — ABNORMAL HIGH (ref 70–99)
Glucose-Capillary: 286 mg/dL — ABNORMAL HIGH (ref 70–99)

## 2014-02-11 MED ORDER — METOPROLOL TARTRATE 25 MG PO TABS: 25.0000 mg | ORAL_TABLET | Freq: Two times a day (BID) | ORAL | Status: AC

## 2014-02-11 MED ORDER — INSULIN ASPART 100 UNIT/ML ~~LOC~~ SOLN: 0.0000 [IU] | Freq: Three times a day (TID) | SUBCUTANEOUS | Status: DC

## 2014-02-11 MED ORDER — HYDRALAZINE HCL 25 MG PO TABS: 25.0000 mg | ORAL_TABLET | Freq: Three times a day (TID) | ORAL | Status: AC

## 2014-02-11 MED ORDER — INSULIN GLARGINE 100 UNIT/ML ~~LOC~~ SOLN: 44.0000 [IU] | Freq: Every day | SUBCUTANEOUS | Status: DC

## 2014-02-11 MED ORDER — ACETAMINOPHEN 325 MG PO TABS: 650.0000 mg | ORAL_TABLET | Freq: Four times a day (QID) | ORAL | Status: AC | PRN

## 2014-02-11 MED ORDER — INSULIN ASPART 100 UNIT/ML ~~LOC~~ SOLN: 6.0000 [IU] | Freq: Three times a day (TID) | SUBCUTANEOUS | Status: DC

## 2014-02-11 MED ORDER — ONDANSETRON HCL 4 MG PO TABS: 4.0000 mg | ORAL_TABLET | Freq: Four times a day (QID) | ORAL | Status: AC | PRN

## 2014-02-11 MED ORDER — HYDRALAZINE HCL 25 MG PO TABS: 25.0000 mg | ORAL_TABLET | Freq: Three times a day (TID) | ORAL | Status: DC

## 2014-02-11 MED ORDER — INSULIN ASPART 100 UNIT/ML ~~LOC~~ SOLN: 9.0000 [IU] | Freq: Three times a day (TID) | SUBCUTANEOUS | Status: DC

## 2014-02-11 MED ORDER — METOPROLOL TARTRATE 25 MG PO TABS: 25.0000 mg | ORAL_TABLET | Freq: Two times a day (BID) | ORAL | Status: DC

## 2014-02-11 NOTE — Progress Notes (Signed)
Patient was discharged to [facility]) by MD order; discharged instructions  review and give to patient and caregiver with care notes and prescriptions; IV DIC; skin intact; patient will be escorted to the car by nurse tech via wheelchair.

## 2014-02-11 NOTE — Discharge Instructions (Signed)
Please see Dr. Rex Kras early next week. Please be strict about taking your blood pressure medications and your insulin as prescribed.

## 2014-02-11 NOTE — Discharge Summary (Addendum)
Physician Discharge Summary  [name] COCHRANE Sr. WUJ:[id number] DOB: Sep 11, 1934 DOA: 02/08/2014  PCP: Tamsen Roers, MD  Admit date: 02/08/2014 Discharge date: 02/11/2014  Time spent: 50 minutes  Recommendations for Outpatient Follow-up:  1. Monitor BP, titrate medications 2. Monitor CBGs, titrate medications 3. Unfortunately patient refused SNF and will be discharged to home with [facility] RN, PT, OT, Aide, and Social Work. We are very concerned about his ability to manage his insulin and BP medications on his own.  Discharge Diagnoses:  Principal Problem:   Type 2 diabetes mellitus with hyperosmolarity without nonketotic hyperglycemic-hyperosmolar coma ([facility]) Active Problems:   HYPERLIPIDEMIA   CAD   Frequent falls   Tobacco abuse   Chronic diastolic heart failure   Hypertensive urgency   Discharge Condition: stable  Diet recommendation: carb mod   Filed Weights   02/08/14 2012  Weight: 82.4 kg (181 lb 10.5 oz)    History of present illness:  [name] HOLLERN Sr. is a 79 y.o. male with PMH significant for hypertension, Diabetes, and prior stroke who presents to ED complaining of increase urinary frequency, urgency, and elevated blood sugar. Patient' s blood sugar has been elevated for last 2 months. Worse over last week. He was seen at [facility] ED twice. Last time he was found to have UTI/ he was discharged on ciprofloxacin. Patient lives independent living facility. Caregiver relates patient needs assistance with medications, his urinary incontinence is worse. He denies chest pain, dyspnea, no focal weakness, no headaches.  In the ED his blood pressure was found to be at 210/80, CBG at 600.   Hospital Course:  Hyperosmolar nonketotic state in setting of DMII.  A1C is 11.9  CBGs much better controlled-On current dosing of lantus and novolog Patient is unable to manage his insulin at home.  We attempted to discharge him to SNF but he refused.  Consequently he will be discharged to  [facility] living with full home health resources.  Malignant hypertension  IV hydralazine utilized and an inpatient. Restarted home dose metoprolol on 8/12.  BP remained high and patient continued to require IV hydralazine. Will add po hydralazine TID at discharge and request SNF MD to continue to monitor and titrate BP medications.  Frequent falls  Suspect multifactorial which could include elevated blood sugars, elevated blood pressure, CHF and overall deconditioning. Physical therapy recommends 24 hour assistance.  Patient being d/c'd to SNF.   Hyperlipidemia  Not on a statin per Dr. Kennon Holter note of 12/2013   Chronic diastolic heart failure:  BNP stable.  Normal LV function per Dr. Kennon Holter 12/2013 note.  On beta blocker for irregular rhythm (scattered PVCs)  Patient does not appear volume overloaded.   CAD: Stable. Troponin level normal.   Recent UTI placed on cipro prior to admission:  Culture negative. Course of antibiotics completed.   Pressure ulceration  Appreciate WOC recs.  Foam dressing to protect and promote healing.   Procedures:  none  Consultations:  none  Discharge Exam: Filed Vitals:   02/11/14 0715  BP: 119/52  Pulse: 79  Temp:   Resp:    General: Alert and oriented x2, no acute distress. Appears clean and comfortable lying in bed. Cardiovascular: Regular rate and rhythm, N5-A2, 2/6 systolic ejection murmur.  No lower extremity edema. Respiratory: Clear auscultation bilaterally, no accessory muscle movement.  Abdomen: Soft, nontender, nondistended, positive bowel sounds  Musculoskeletal: No clubbing or cyanosis    Discharge Instructions       Discharge Instructions   Diet Carb Modified  Complete by:  As directed      Diet Carb Modified    Complete by:  As directed      Increase activity slowly    Complete by:  As directed      Increase activity slowly    Complete by:  As directed             Medication List    STOP taking  these medications       ciprofloxacin 500 MG tablet  Commonly known as:  CIPRO     metFORMIN 500 MG tablet  Commonly known as:  GLUCOPHAGE      TAKE these medications       acetaminophen 325 MG tablet  Commonly known as:  TYLENOL  Take 2 tablets (650 mg total) by mouth every 6 (six) hours as needed for mild pain (or Fever >/= 101).     aspirin 325 MG tablet  Take 325 mg by mouth daily.     hydrALAZINE 25 MG tablet  Commonly known as:  APRESOLINE  Take 1 tablet (25 mg total) by mouth 3 (three) times daily.     insulin aspart 100 UNIT/ML injection  Commonly known as:  novoLOG  Inject 9 Units into the skin 3 (three) times daily with meals.     insulin glargine 100 UNIT/ML injection  Commonly known as:  LANTUS  Inject 0.44 mLs (44 Units total) into the skin daily.     metoprolol tartrate 25 MG tablet  Commonly known as:  LOPRESSOR  Take 1 tablet (25 mg total) by mouth 2 (two) times daily.     multivitamin tablet  Take 1 tablet by mouth daily.     ondansetron 4 MG tablet  Commonly known as:  ZOFRAN  Take 1 tablet (4 mg total) by mouth every 6 (six) hours as needed for nausea.       Allergies  Allergen Reactions  . Darvon [Propoxyphene Hcl] Other (See Comments)    Severe sweating  . Crestor [Rosuvastatin Calcium] Nausea And Vomiting  . Meperidine Hcl Other (See Comments)    Passes out   Follow-up Information   Follow up with LITTLE,JAMES, MD In 5 days.   Specialty:  Family Medicine   Contact information:   [address] [place] [address] [phone]        The results of significant diagnostics from this hospitalization (including imaging, microbiology, ancillary and laboratory) are listed below for reference.    Significant Diagnostic Studies: Dg Chest 2 View  02/08/2014   CLINICAL DATA:  Hyperglycemia.  Diabetes.  Hypertension.  EXAM: CHEST  2 VIEW  COMPARISON:  02/01/2014  FINDINGS: Prior CABG. Atherosclerotic aortic arch. Stable slightly lobular left  hemidiaphragm. Degenerative glenohumeral arthropathy on the left with a left-sided scapular screw.  Thoracic spondylosis.  No pleural effusion identified.  IMPRESSION: 1. Prior CABG, without cardiomegaly or edema. 2. Overall stable appearance of the chest without acute findings.   Electronically Signed   By: Sherryl Barters M.D.   On: 02/08/2014 19:06    Microbiology: Recent Results (from the past 240 hour(s))  URINE CULTURE     Status: None   Collection Time    02/08/14  9:59 PM      Result Value Ref Range Status   Specimen Description URINE, RANDOM   Final   Special Requests NONE   Final   Culture  Setup Time     Final   Value: 02/08/2014 22:22  Performed at [facility]     Final   Value: NO GROWTH     Performed at [facility]   Culture     Final   Value: NO GROWTH     Performed at [facility]   Report Status 02/10/2014 FINAL   Final     Labs: Basic Metabolic Panel:  Recent Labs Lab 02/08/14 1448 02/09/14 0630  NA 134* 137  K 4.9 3.8  CL 94* 100  CO2 26 24  GLUCOSE 612* 242*  BUN 23 13  CREATININE 0.83 0.57  CALCIUM 9.5 8.9   Liver Function Tests:  Recent Labs Lab 02/08/14 1448  AST 16  ALT 16  ALKPHOS 148*  BILITOT 0.4  PROT 6.5  ALBUMIN 3.6   CBC:  Recent Labs Lab 02/08/14 1448 02/09/14 0630  WBC 7.7 7.3  HGB 13.3 15.3  HCT 40.0 45.8  MCV 93.9 94.0  PLT 153 115*   Cardiac Enzymes:  Recent Labs Lab 02/08/14 2021 02/09/14 0052 02/09/14 0630  TROPONINI <0.30 <0.30 <0.30   BNP: BNP (last 3 results)  Recent Labs  02/09/14 1913  PROBNP 169.8   CBG:  Recent Labs Lab 02/10/14 1209 02/10/14 1710 02/10/14 2105 02/11/14 0734 02/11/14 1143  GLUCAP 300* 162* 167* 147* 286*    Signed:  Karen Kitchens [phone]  [facility] 02/11/2014, 2:45 PM  Attending Patient was seen, examined,treatment plan was discussed with the Physician extender. I have directly reviewed the  clinical findings, lab, imaging studies and management of this patient in detail. I have made the necessary changes to the above noted documentation, and agree with the documentation, as recorded by the Physician extender.  Nena Alexander MD [facility].

## 2014-02-11 NOTE — Progress Notes (Signed)
OK per MD for d/c today.  Patient insists on returning to his room at [facility] and PT does not recommend SNF care.  Patient has a in-home caregiver with [facility] who came to the hospital demanding that patient be "sent to a nursing home."  CSW, with permission from patient, explained to caregiver that he is insistent in returning to his independent living arrangement and CSW and hospital staff cannot "force patient do do what he does not wish to do."  Patient declined to allow CSW to contact his son as he stated that his son will not listen to his wishes.  The caregiver contacted his son and relayed that he was being d/cd . She will arrange for another staff member from [facility] to come and pick patient up.  [facility]  PT/OT, Nursing and SW services requested to monitor for patient's needs at the facility.  Nursing notified of above.  No further CSW needs identified.  Lorie Phenix. Pauline Good, [place]

## 2014-03-02 ENCOUNTER — Encounter (HOSPITAL_COMMUNITY): Payer: Self-pay | Admitting: Emergency Medicine

## 2014-03-02 ENCOUNTER — Inpatient Hospital Stay (HOSPITAL_COMMUNITY)
Admission: EM | Admit: 2014-03-02 | Discharge: 2014-03-04 | DRG: 638 | Disposition: A | Discharge status: Skilled Nursing Facility | Payer: Medicare Other | Attending: Internal Medicine | Admitting: Internal Medicine

## 2014-03-02 ENCOUNTER — Inpatient Hospital Stay (HOSPITAL_COMMUNITY): Payer: Medicare Other

## 2014-03-02 DIAGNOSIS — Z86718 Personal history of other venous thrombosis and embolism: Secondary | ICD-10-CM

## 2014-03-02 DIAGNOSIS — F17200 Nicotine dependence, unspecified, uncomplicated: Secondary | ICD-10-CM | POA: Diagnosis present

## 2014-03-02 DIAGNOSIS — IMO0001 Reserved for inherently not codable concepts without codable children: Secondary | ICD-10-CM

## 2014-03-02 DIAGNOSIS — Z794 Long term (current) use of insulin: Secondary | ICD-10-CM | POA: Diagnosis not present

## 2014-03-02 DIAGNOSIS — Z9119 Patient's noncompliance with other medical treatment and regimen: Secondary | ICD-10-CM

## 2014-03-02 DIAGNOSIS — R739 Hyperglycemia, unspecified: Secondary | ICD-10-CM

## 2014-03-02 DIAGNOSIS — Z7982 Long term (current) use of aspirin: Secondary | ICD-10-CM

## 2014-03-02 DIAGNOSIS — M129 Arthropathy, unspecified: Secondary | ICD-10-CM | POA: Diagnosis present

## 2014-03-02 DIAGNOSIS — Z91199 Patient's noncompliance with other medical treatment and regimen due to unspecified reason: Secondary | ICD-10-CM | POA: Diagnosis not present

## 2014-03-02 DIAGNOSIS — I63239 Cerebral infarction due to unspecified occlusion or stenosis of unspecified carotid arteries: Secondary | ICD-10-CM

## 2014-03-02 DIAGNOSIS — I160 Hypertensive urgency: Secondary | ICD-10-CM

## 2014-03-02 DIAGNOSIS — Z720 Tobacco use: Secondary | ICD-10-CM | POA: Diagnosis present

## 2014-03-02 DIAGNOSIS — E1165 Type 2 diabetes mellitus with hyperglycemia: Secondary | ICD-10-CM | POA: Diagnosis present

## 2014-03-02 DIAGNOSIS — I739 Peripheral vascular disease, unspecified: Secondary | ICD-10-CM | POA: Diagnosis present

## 2014-03-02 DIAGNOSIS — Z8546 Personal history of malignant neoplasm of prostate: Secondary | ICD-10-CM | POA: Diagnosis not present

## 2014-03-02 DIAGNOSIS — I2510 Atherosclerotic heart disease of native coronary artery without angina pectoris: Secondary | ICD-10-CM | POA: Diagnosis present

## 2014-03-02 DIAGNOSIS — Z79899 Other long term (current) drug therapy: Secondary | ICD-10-CM

## 2014-03-02 DIAGNOSIS — Z8673 Personal history of transient ischemic attack (TIA), and cerebral infarction without residual deficits: Secondary | ICD-10-CM

## 2014-03-02 DIAGNOSIS — R627 Adult failure to thrive: Secondary | ICD-10-CM | POA: Diagnosis present

## 2014-03-02 DIAGNOSIS — I6529 Occlusion and stenosis of unspecified carotid artery: Secondary | ICD-10-CM | POA: Diagnosis present

## 2014-03-02 DIAGNOSIS — I10 Essential (primary) hypertension: Secondary | ICD-10-CM | POA: Diagnosis present

## 2014-03-02 DIAGNOSIS — I5032 Chronic diastolic (congestive) heart failure: Secondary | ICD-10-CM | POA: Diagnosis present

## 2014-03-02 DIAGNOSIS — E119 Type 2 diabetes mellitus without complications: Secondary | ICD-10-CM

## 2014-03-02 DIAGNOSIS — R296 Repeated falls: Secondary | ICD-10-CM

## 2014-03-02 DIAGNOSIS — I6350 Cerebral infarction due to unspecified occlusion or stenosis of unspecified cerebral artery: Secondary | ICD-10-CM

## 2014-03-02 DIAGNOSIS — I499 Cardiac arrhythmia, unspecified: Secondary | ICD-10-CM

## 2014-03-02 DIAGNOSIS — E785 Hyperlipidemia, unspecified: Secondary | ICD-10-CM

## 2014-03-02 DIAGNOSIS — C61 Malignant neoplasm of prostate: Secondary | ICD-10-CM

## 2014-03-02 DIAGNOSIS — IMO0002 Reserved for concepts with insufficient information to code with codable children: Secondary | ICD-10-CM | POA: Diagnosis present

## 2014-03-02 DIAGNOSIS — Z951 Presence of aortocoronary bypass graft: Secondary | ICD-10-CM

## 2014-03-02 DIAGNOSIS — E1101 Type 2 diabetes mellitus with hyperosmolarity with coma: Secondary | ICD-10-CM

## 2014-03-02 DIAGNOSIS — I70219 Atherosclerosis of native arteries of extremities with intermittent claudication, unspecified extremity: Secondary | ICD-10-CM

## 2014-03-02 DIAGNOSIS — I509 Heart failure, unspecified: Secondary | ICD-10-CM | POA: Diagnosis present

## 2014-03-02 DIAGNOSIS — E1100 Type 2 diabetes mellitus with hyperosmolarity without nonketotic hyperglycemic-hyperosmolar coma (NKHHC): Principal | ICD-10-CM | POA: Diagnosis present

## 2014-03-02 DIAGNOSIS — R7309 Other abnormal glucose: Secondary | ICD-10-CM | POA: Diagnosis present

## 2014-03-02 DIAGNOSIS — R011 Cardiac murmur, unspecified: Secondary | ICD-10-CM

## 2014-03-02 HISTORY — DX: Heart failure, unspecified: I50.9

## 2014-03-02 LAB — BASIC METABOLIC PANEL
ANION GAP: 12 (ref 5–15)
Anion gap: 14 (ref 5–15)
Anion gap: 15 (ref 5–15)
Anion gap: 12 (ref 5–15)
BUN: 37 mg/dL — ABNORMAL HIGH (ref 6–23)
BUN: 35 mg/dL — AB (ref 6–23)
BUN: 30 mg/dL — ABNORMAL HIGH (ref 6–23)
BUN: 29 mg/dL — AB (ref 6–23)
CALCIUM: 10.3 mg/dL (ref 8.4–10.5)
CALCIUM: 10 mg/dL (ref 8.4–10.5)
CHLORIDE: 87 meq/L — AB (ref 96–112)
CHLORIDE: 89 meq/L — AB (ref 96–112)
CO2: 29 mEq/L (ref 19–32)
CO2: 31 meq/L (ref 19–32)
CO2: 30 mEq/L (ref 19–32)
CO2: 29 mEq/L (ref 19–32)
Calcium: 10.4 mg/dL (ref 8.4–10.5)
Calcium: 10.6 mg/dL — ABNORMAL HIGH (ref 8.4–10.5)
Chloride: 95 mEq/L — ABNORMAL LOW (ref 96–112)
Chloride: 99 mEq/L (ref 96–112)
Creatinine, Ser: 0.96 mg/dL (ref 0.50–1.35)
Creatinine, Ser: 0.92 mg/dL (ref 0.50–1.35)
Creatinine, Ser: 0.77 mg/dL (ref 0.50–1.35)
Creatinine, Ser: 0.75 mg/dL (ref 0.50–1.35)
GFR calc Af Amer: >90 mL/min (ref >90)
GFR calc Af Amer: >90 mL/min (ref >90)
GFR calc non Af Amer: 79 mL/min — ABNORMAL LOW (ref >90)
GFR calc non Af Amer: 85 mL/min — ABNORMAL LOW (ref >90)
GFR, EST NON AFRICAN AMERICAN: 78 mL/min — AB (ref >90)
GFR, EST NON AFRICAN AMERICAN: 86 mL/min — AB (ref >90)
GLUCOSE: 482 mg/dL — AB (ref 70–99)
GLUCOSE: 266 mg/dL — AB (ref 70–99)
GLUCOSE: 145 mg/dL — AB (ref 70–99)
Glucose, Bld: 772 mg/dL (ref 70–99)
POTASSIUM: 7.1 meq/L — AB (ref 3.7–5.3)
POTASSIUM: 5 meq/L (ref 3.7–5.3)
Potassium: 4 mEq/L (ref 3.7–5.3)
Potassium: 4.3 mEq/L (ref 3.7–5.3)
SODIUM: 134 meq/L — AB (ref 137–147)
SODIUM: 140 meq/L (ref 137–147)
Sodium: 128 mEq/L — ABNORMAL LOW (ref 137–147)
Sodium: 140 mEq/L (ref 137–147)

## 2014-03-02 LAB — CBC
HCT: 44.6 % (ref 39.0–52.0)
Hemoglobin: 15.2 g/dL (ref 13.0–17.0)
MCH: 31.6 pg (ref 26.0–34.0)
MCHC: 34.1 g/dL (ref 30.0–36.0)
MCV: 92.7 fL (ref 78.0–100.0)
PLATELETS: 184 10*3/uL (ref 150–400)
RBC: 4.81 MIL/uL (ref 4.22–5.81)
RDW: 14.2 % (ref 11.5–15.5)
WBC: 11 10*3/uL — AB (ref 4.0–10.5)

## 2014-03-02 LAB — URINALYSIS, ROUTINE W REFLEX MICROSCOPIC
Bilirubin Urine: NEGATIVE
Glucose, UA: >1000 mg/dL — AB
HGB URINE DIPSTICK: NEGATIVE
KETONES UR: NEGATIVE mg/dL
Leukocytes, UA: NEGATIVE
Nitrite: NEGATIVE
Protein, ur: NEGATIVE mg/dL
Specific Gravity, Urine: 1.021 (ref 1.005–1.030)
UROBILINOGEN UA: 0.2 mg/dL (ref 0.0–1.0)
pH: 6.5 (ref 5.0–8.0)

## 2014-03-02 LAB — CBG MONITORING, ED
Glucose-Capillary: >600 mg/dL (ref 70–99)
Glucose-Capillary: 547 mg/dL — ABNORMAL HIGH (ref 70–99)
Glucose-Capillary: 483 mg/dL — ABNORMAL HIGH (ref 70–99)
Glucose-Capillary: 475 mg/dL — ABNORMAL HIGH (ref 70–99)

## 2014-03-02 LAB — URINE MICROSCOPIC-ADD ON

## 2014-03-02 LAB — GLUCOSE, CAPILLARY
Glucose-Capillary: 481 mg/dL — ABNORMAL HIGH (ref 70–99)
Glucose-Capillary: 369 mg/dL — ABNORMAL HIGH (ref 70–99)
Glucose-Capillary: 283 mg/dL — ABNORMAL HIGH (ref 70–99)
Glucose-Capillary: 153 mg/dL — ABNORMAL HIGH (ref 70–99)

## 2014-03-02 LAB — POTASSIUM: POTASSIUM: 5.1 meq/L (ref 3.7–5.3)

## 2014-03-02 LAB — MRSA PCR SCREENING: MRSA by PCR: NEGATIVE

## 2014-03-02 MED ORDER — DEXTROSE-NACL 5-0.45 % IV SOLN: INTRAVENOUS | Status: DC

## 2014-03-02 MED ORDER — ASPIRIN 325 MG PO TABS
325.0000 mg | ORAL_TABLET | Freq: Every day | ORAL | Status: DC
  Administered 2014-03-03 – 2014-03-04 (×2): 325 mg via ORAL
  Filled 2014-03-02 (×2): qty 1

## 2014-03-02 MED ORDER — INSULIN ASPART 100 UNIT/ML ~~LOC~~ SOLN
15.0000 [IU] | Freq: Once | SUBCUTANEOUS | Status: AC
  Administered 2014-03-02: 15 [IU] via INTRAVENOUS
  Filled 2014-03-02: qty 1

## 2014-03-02 MED ORDER — DEXTROSE-NACL 5-0.45 % IV SOLN
INTRAVENOUS | Status: DC
  Administered 2014-03-02: 20:00:00 via INTRAVENOUS

## 2014-03-02 MED ORDER — SODIUM CHLORIDE 0.9 % IV BOLUS (SEPSIS)
2000.0000 mL | Freq: Once | INTRAVENOUS | Status: AC
  Administered 2014-03-02: 2000 mL via INTRAVENOUS

## 2014-03-02 MED ORDER — HEPARIN SODIUM (PORCINE) 5000 UNIT/ML IJ SOLN
5000.0000 [IU] | Freq: Three times a day (TID) | INTRAMUSCULAR | Status: DC
  Administered 2014-03-03 – 2014-03-04 (×5): 5000 [IU] via SUBCUTANEOUS
  Filled 2014-03-02 (×6): qty 1

## 2014-03-02 MED ORDER — HYDRALAZINE HCL 25 MG PO TABS
25.0000 mg | ORAL_TABLET | Freq: Three times a day (TID) | ORAL | Status: DC
  Administered 2014-03-02 – 2014-03-04 (×6): 25 mg via ORAL
  Filled 2014-03-02 (×8): qty 1

## 2014-03-02 MED ORDER — SODIUM CHLORIDE 0.9 % IV SOLN
INTRAVENOUS | Status: DC
  Administered 2014-03-02: 8.4 [IU]/h via INTRAVENOUS

## 2014-03-02 MED ORDER — INSULIN ASPART 100 UNIT/ML ~~LOC~~ SOLN
0.0000 [IU] | SUBCUTANEOUS | Status: DC
  Administered 2014-03-02: 24 [IU] via SUBCUTANEOUS
  Filled 2014-03-02: qty 1

## 2014-03-02 MED ORDER — CALCIUM GLUCONATE 10 % IV SOLN
1.0000 g | Freq: Once | INTRAVENOUS | Status: AC
  Administered 2014-03-02: 1 g via INTRAVENOUS
  Filled 2014-03-02: qty 10

## 2014-03-02 MED ORDER — HYDROCODONE-ACETAMINOPHEN 5-325 MG PO TABS
1.0000 | ORAL_TABLET | Freq: Four times a day (QID) | ORAL | Status: DC | PRN
  Administered 2014-03-02: 1 via ORAL
  Filled 2014-03-02: qty 1

## 2014-03-02 MED ORDER — SODIUM CHLORIDE 0.9 % IV SOLN
INTRAVENOUS | Status: DC | PRN
  Administered 2014-03-02: 8.4 [IU]/h via INTRAVENOUS
  Filled 2014-03-02: qty 2.5

## 2014-03-02 MED ORDER — ONE-DAILY MULTI VITAMINS PO TABS
1.0000 | ORAL_TABLET | Freq: Every day | ORAL | Status: DC
  Administered 2014-03-03 – 2014-03-04 (×2): 1 via ORAL
  Filled 2014-03-02 (×2): qty 1

## 2014-03-02 MED ORDER — SODIUM CHLORIDE 0.9 % IV SOLN
INTRAVENOUS | Status: DC
  Administered 2014-03-02 – 2014-03-04 (×4): via INTRAVENOUS

## 2014-03-02 MED ORDER — FUROSEMIDE 10 MG/ML IJ SOLN
40.0000 mg | Freq: Once | INTRAMUSCULAR | Status: AC
  Administered 2014-03-02: 40 mg via INTRAVENOUS
  Filled 2014-03-02: qty 4

## 2014-03-02 MED ORDER — ENOXAPARIN SODIUM 40 MG/0.4ML ~~LOC~~ SOLN
40.0000 mg | SUBCUTANEOUS | Status: DC
  Filled 2014-03-02: qty 0.4

## 2014-03-02 MED ORDER — DEXTROSE 50 % IV SOLN
25.0000 mL | INTRAVENOUS | Status: DC | PRN
Start: 2014-03-02 — End: 2014-03-03

## 2014-03-02 MED ORDER — METOPROLOL TARTRATE 25 MG PO TABS
25.0000 mg | ORAL_TABLET | Freq: Two times a day (BID) | ORAL | Status: DC
  Administered 2014-03-02 – 2014-03-04 (×4): 25 mg via ORAL
  Filled 2014-03-02 (×5): qty 1

## 2014-03-02 NOTE — ED Notes (Signed)
CBG 475. 

## 2014-03-02 NOTE — H&P (Signed)
History and Physical       Hospital Admission Note Date: 03/02/2014  Patient name: [name] DEBOY Sr. Medical record number: [id number] Date of birth: [birth year] Age: 79 y.o. Gender: male  PCP: Tamsen Roers, MD    Chief Complaint:  Hyperglycemia  HPI: Patient is a 79 year old male with history of diabetes, hypertension, prior CVA who presented to ED with increased urinary frequency, elevated blood sugars, sweating. Patient's blood sugar was over 700 the time of arrival. The patient lives at home and unable to manage his insulin. Patient was recommended SNF during the previous admission and was recently discharged on 02/11/14 but he had refused skilled nursing facility. Currently patient lives in an independent retirement home. Patient has CNA's for 3 hours but otherwise he is by himself. He's not been eating or drinking properly either. No recent falls. Patient was accompanied by his caregiver in the ER who strongly feels that patient cannot manage in the independent living facility at this time.   Review of Systems:  Constitutional: Denies fever, chills, diaphoresis, poor appetite and fatigue.  HEENT: Denies photophobia, eye pain, redness, hearing loss, ear pain, congestion, sore throat, rhinorrhea, sneezing, mouth sores, trouble swallowing, neck pain, neck stiffness and tinnitus.   Respiratory: Denies SOB, DOE, cough, chest tightness,  and wheezing.   Cardiovascular: Denies chest pain, palpitations and leg swelling.  Gastrointestinal: Denies nausea, vomiting, abdominal pain, diarrhea, constipation, blood in stool and abdominal distention.  Genitourinary: Denies dysuria, urgency, frequency, hematuria, flank pain and difficulty urinating.  Musculoskeletal: Denies myalgias, back pain, joint swelling, arthralgias and gait problem.  Skin: Denies pallor, rash and wound.  Neurological: Denies dizziness, seizures, syncope, weakness,  light-headedness, numbness and headaches.  Hematological: Denies adenopathy. Easy bruising, personal or family bleeding history  Psychiatric/Behavioral: Denies suicidal ideation, mood changes, confusion, nervousness, sleep disturbance and agitation  Past Medical History: Past Medical History  Diagnosis Date  . Diabetes mellitus   . Hypertension   . Hyperlipidemia   . Arthritis   . Peripheral vascular disease   . Hernia of abdominal wall   . DVT (deep venous thrombosis)   . Cancer     prostate  . Stroke 1998    History of 2 CVA  two weeks apart  . Carotid artery disease   . Coronary artery disease   . CHF (congestive heart failure)     diastolic   Past Surgical History  Procedure Laterality Date  . Prostate surgery      SEED implant  . Carotid endarterectomy  07-27-10    Right CEA  . Carotid endarterectomy  2003    Left CEA redo  . Carotid endarterectomy  1998    Left CEA  . Coronary artery bypass graft  2012    CABG x 3 vessel by Dr. Cyndia Bent  . Shoulder fusion surgery  1980's    for recurrent dislocation  . Foot surgery    . Pr vein bypass graft,aorto-fem-pop  07/27/10  . Tooth extraction  July 2013    Medications: Prior to Admission medications   Medication Sig Start Date End Date Taking? Authorizing Provider  acetaminophen (TYLENOL) 325 MG tablet Take 2 tablets (650 mg total) by mouth every 6 (six) hours as needed for mild pain (or Fever >/= 101). 02/11/14  Yes Bobby Rumpf York, PA-C  aspirin 325 MG tablet Take 325 mg by mouth daily.   Yes Historical Provider, MD  hydrALAZINE (APRESOLINE) 25 MG tablet Take 1 tablet (25 mg total) by mouth 3 (three) times daily.  02/11/14  Yes Marianne L York, PA-C  insulin aspart (NOVOLOG) 100 UNIT/ML injection Inject 9 Units into the skin 3 (three) times daily with meals. 02/11/14  Yes Marianne L York, PA-C  insulin glargine (LANTUS) 100 UNIT/ML injection Inject 0.44 mLs (44 Units total) into the skin daily. 02/11/14  Yes Bobby Rumpf York,  PA-C  metoprolol tartrate (LOPRESSOR) 25 MG tablet Take 1 tablet (25 mg total) by mouth 2 (two) times daily. 02/11/14  Yes Marianne L York, PA-C  Multiple Vitamin (MULTIVITAMIN) tablet Take 1 tablet by mouth daily.   Yes Historical Provider, MD  ondansetron (ZOFRAN) 4 MG tablet Take 1 tablet (4 mg total) by mouth every 6 (six) hours as needed for nausea. 02/11/14  Yes Melton Alar, PA-C    Allergies:   Allergies  Allergen Reactions  . Darvon [Propoxyphene Hcl] Other (See Comments)    Severe sweating  . Crestor [Rosuvastatin Calcium] Nausea And Vomiting  . Meperidine Hcl Other (See Comments)    Passes out    Social History:  reports that he quit smoking about 2 years ago. His smoking use included Cigarettes. He has a 12.5 pack-year smoking history. He quit smokeless tobacco use about 2 years ago. He reports that he does not drink alcohol or use illicit drugs.  Family History: Family History  Problem Relation Age of Onset  . Heart disease Father     Heart Disease before age 34  . Heart disease Brother   . Heart attack Brother   . Hypertension Mother   . Heart attack Mother     Physical Exam: Blood pressure 124/74, pulse 99, temperature 98.7 F (37.1 C), temperature source Oral, resp. rate 18, SpO2 95.00%. General: Alert, awake, oriented x3, in no acute distress. HEENT: normocephalic, atraumatic, anicteric sclera, pink conjunctiva, pupils equal and reactive to light and accomodation, oropharynx clear Neck: supple, no masses or lymphadenopathy, no goiter, no bruits  Heart: Regular rate and rhythm, without murmurs, rubs or gallops. Lungs: Clear to auscultation bilaterally, no wheezing, rales or rhonchi. Abdomen: Soft, nontender, nondistended, positive bowel sounds, no masses. Extremities: No clubbing, cyanosis or edema with positive pedal pulses. Neuro: Grossly intact, no focal neurological deficits, strength 5/5 upper and lower extremities bilaterally Psych: alert and oriented  x 3, normal mood and affect Skin: no rashes or lesions, warm and dry   LABS on Admission:  Basic Metabolic Panel:  Recent Labs Lab 03/02/14 1138 03/02/14 1245  NA 128*  --   K 7.1* 5.1  CL 87*  --   CO2 29  --   GLUCOSE 772*  --   BUN 37*  --   CREATININE 0.96  --   CALCIUM 10.4  --    Liver Function Tests: No results found for this basename: AST, ALT, ALKPHOS, BILITOT, PROT, ALBUMIN,  in the last 168 hours No results found for this basename: LIPASE, AMYLASE,  in the last 168 hours No results found for this basename: AMMONIA,  in the last 168 hours CBC:  Recent Labs Lab 03/02/14 1138  WBC 11.0*  HGB 15.2  HCT 44.6  MCV 92.7  PLT 184   Cardiac Enzymes: No results found for this basename: CKTOTAL, CKMB, CKMBINDEX, TROPONINI,  in the last 168 hours BNP: No components found with this basename: POCBNP,  CBG:  Recent Labs Lab 03/02/14 1331 03/02/14 1443  GLUCAP 547* 483*     Radiological Exams on Admission: Dg Chest 2 View  02/08/2014   CLINICAL DATA:  Hyperglycemia.  Diabetes.  Hypertension.  EXAM: CHEST  2 VIEW  COMPARISON:  02/01/2014  FINDINGS: Prior CABG. Atherosclerotic aortic arch. Stable slightly lobular left hemidiaphragm. Degenerative glenohumeral arthropathy on the left with a left-sided scapular screw.  Thoracic spondylosis.  No pleural effusion identified.  IMPRESSION: 1. Prior CABG, without cardiomegaly or edema. 2. Overall stable appearance of the chest without acute findings.   Electronically Signed   By: Sherryl Barters M.D.   On: 02/08/2014 19:06    Assessment/Plan Principal Problem:   Type 2 diabetes mellitus with hyperosmolar hyperglycemia:  - Patient's blood sugars > 700 the time of arrival, currently on insulin drip. Will continue insulin drip until blood sugars are better controlled 150-170 range, then will transition to Lantus 44 units and subcutaneous insulin. Hemoglobin A1c was 11.7 on 8/10. - Continue gentle IV fluid hydration (due to  his history of CHF avoid aggressive IV fluids)   Active Problems:   CAD, hypertension - Currently stable, continue aspirin, beta blocker, hydralazine - BP is stable    Tobacco abuse - Patient reports that he smokes occasionally now    Chronic diastolic heart failure - Currently stable, avoid aggressive IV fluid hydration  Failure to thrive - Patient needs higher level of care at this time, we'll place PT/OT consult, social worker consult  DVT prophylaxis: Lovenox  CODE STATUS: Full code  Family Communication: Admission, patients condition and plan of care including tests being ordered have been discussed with the patient who indicates understanding and agree with the plan and Code Status   Further plan will depend as patient's clinical course evolves and further radiologic and laboratory data become available.   Time Spent on Admission: 1 hour  RAI,RIPUDEEP M.D. [facility] 03/02/2014, 3:15 PM Pager: [phone]  If 7PM-7AM, please contact night-coverage [email or web address] Password [email or web address]  **Disclaimer: This note was dictated with voice recognition software. Similar sounding words can inadvertently be transcribed and this note may contain transcription errors which may not have been corrected upon publication of note.**

## 2014-03-02 NOTE — ED Notes (Signed)
Pt's caretaker at bedside - reports the pt c/o dizziness and generalized this morning then became diaphoretic. sts that this happens often. Denies any recent falls. Denies dementia/problems remembering things. Pt reports that he is just here because he blood sugar has been running high at home and needs to get it fixed. sts it has been upper 200s into 300s. Nad, skin warm and dry, resp e/u.

## 2014-03-02 NOTE — ED Notes (Signed)
Notified RN of CBG [phone]

## 2014-03-02 NOTE — ED Notes (Addendum)
Pt reports intermittent dizziness x "weeks". States "I get so dizzy sometimes that I fall." Reports last time he had a syncopal episode was 4 weeks ago, but daily has "spells" of feeling dizzy. Pt denies any dizziness at this time. Pt had CABG 3 weeks ago, denies any CP or SOB. Pt is AO x4, NAD. Neuro intact.

## 2014-03-02 NOTE — ED Notes (Signed)
Pt's son, Yvone Neu, [phone].

## 2014-03-02 NOTE — ED Notes (Signed)
Notified RN of CBG states HI!!

## 2014-03-02 NOTE — Progress Notes (Signed)
Pt arrived to unit accompanied by RN. VS stable. Pt oriented to unit.

## 2014-03-02 NOTE — ED Notes (Signed)
Attempted report 

## 2014-03-02 NOTE — ED Provider Notes (Signed)
I saw and evaluated the patient, reviewed the resident's note and I agree with the findings and plan.   EKG Interpretation   Date/Time:  Tuesday March 02 2014 11:25:10 EDT Ventricular Rate:  102 PR Interval:  312 QRS Duration: 126 QT Interval:  340 QTC Calculation: 443 R Axis:   148 Text Interpretation:  Sinus tachycardia with 1st degree A-V block Right  bundle branch block Left posterior fascicular block ** Bifascicular block  ** Possible Inferior infarct , age undetermined Abnormal ECG possible  peaked T waves as compared to prior ecg Confirmed by Alvon Nygaard  MD, Lennette Bihari  ([id number]) on 03/02/2014 12:42:58 PM  CRITICAL CARE Performed by: Hoy Morn Total critical care time: 33 Critical care time was exclusive of separately billable procedures and treating other patients. Critical care was necessary to treat or prevent imminent or life-threatening deterioration. Critical care was time spent personally by me on the following activities: development of treatment plan with patient and/or surrogate as well as nursing, discussions with consultants, evaluation of patient's response to treatment, examination of patient, obtaining history from patient or surrogate, ordering and performing treatments and interventions, ordering and review of laboratory studies, ordering and review of radiographic studies, pulse oximetry and re-evaluation of patient's condition. Dg Chest 2 View  02/08/2014   CLINICAL DATA:  Hyperglycemia.  Diabetes.  Hypertension.  EXAM: CHEST  2 VIEW  COMPARISON:  02/01/2014  FINDINGS: Prior CABG. Atherosclerotic aortic arch. Stable slightly lobular left hemidiaphragm. Degenerative glenohumeral arthropathy on the left with a left-sided scapular screw.  Thoracic spondylosis.  No pleural effusion identified.  IMPRESSION: 1. Prior CABG, without cardiomegaly or edema. 2. Overall stable appearance of the chest without acute findings.   Electronically Signed   By: Sherryl Barters M.D.   On:  02/08/2014 19:06  Results for orders placed during the hospital encounter of 03/02/14  CBC      Result Value Ref Range   WBC 11.0 (*) 4.0 - 10.5 K/uL   RBC 4.81  4.22 - 5.81 MIL/uL   Hemoglobin 15.2  13.0 - 17.0 g/dL   HCT 44.6  39.0 - 52.0 %   MCV 92.7  78.0 - 100.0 fL   MCH 31.6  26.0 - 34.0 pg   MCHC 34.1  30.0 - 36.0 g/dL   RDW 14.2  11.5 - 15.5 %   Platelets 184  150 - 400 K/uL  BASIC METABOLIC PANEL      Result Value Ref Range   Sodium 128 (*) 137 - 147 mEq/L   Potassium 7.1 (*) 3.7 - 5.3 mEq/L   Chloride 87 (*) 96 - 112 mEq/L   CO2 29  19 - 32 mEq/L   Glucose, Bld 772 (*) 70 - 99 mg/dL   BUN 37 (*) 6 - 23 mg/dL   Creatinine, Ser 0.96  0.50 - 1.35 mg/dL   Calcium 10.4  8.4 - 10.5 mg/dL   GFR calc non Af Amer 78 (*) >90 mL/min   GFR calc Af Amer >90  >90 mL/min   Anion gap 12  5 - 15  URINALYSIS, ROUTINE W REFLEX MICROSCOPIC      Result Value Ref Range   Color, Urine YELLOW  YELLOW   APPearance CLEAR  CLEAR   Specific Gravity, Urine 1.021  1.005 - 1.030   pH 6.5  5.0 - 8.0   Glucose, UA >1000 (*) NEGATIVE mg/dL   Hgb urine dipstick NEGATIVE  NEGATIVE   Bilirubin Urine NEGATIVE  NEGATIVE   Ketones, ur NEGATIVE  NEGATIVE mg/dL   Protein, ur NEGATIVE  NEGATIVE mg/dL   Urobilinogen, UA 0.2  0.0 - 1.0 mg/dL   Nitrite NEGATIVE  NEGATIVE   Leukocytes, UA NEGATIVE  NEGATIVE  POTASSIUM      Result Value Ref Range   Potassium 5.1  3.7 - 5.3 mEq/L  URINE MICROSCOPIC-ADD ON      Result Value Ref Range   WBC, UA 0-2  <3 WBC/hpf   Bacteria, UA RARE  RARE  CBG MONITORING, ED      Result Value Ref Range   Glucose-Capillary >600 (*) 70 - 99 mg/dL  CBG MONITORING, ED      Result Value Ref Range   Glucose-Capillary 547 (*) 70 - 99 mg/dL  CBG MONITORING, ED      Result Value Ref Range   Glucose-Capillary 483 (*) 70 - 99 mg/dL   Comment 1 Notify RN       Patient with hyperkalemia and renal insufficiency.  Initial EKG showed possible concern for a peak T waves.  Patient was  given calcium, Lasix, insulin.  Likely nonketotic hyperosmolar hyperglycemia.  Anion gap normal.  Admit to the hospital.  Insulin drip.  IV hydration.      Hoy Morn, MD 03/02/14 [phone]

## 2014-03-02 NOTE — ED Notes (Signed)
Home health caregiver at bedside sts she spoke with the pt's son and he thinks the next step is going to have the pt transition into a skilled nursing facility. Pt has been having a decline in health care and mental status over the past month. initially the company just stopped by for a little bit but now there are there at least 6 hrs a day. Pt also having trouble remembering certain things. Currently the pt lives in a retirement community but doesn't have enough care provided to him there. MD made aware.

## 2014-03-02 NOTE — ED Provider Notes (Signed)
CSN: [id number]     Arrival date & time 03/02/14  1052 History   First MD Initiated Contact with Patient 03/02/14 1240     Chief Complaint  Patient presents with  . DM2 w/ nonketotic hyperglycemic hyperosmolar coma      HPI Pt is a 79 y/o male w/ PMHx of DM2, CAD and HTN who presents to the ED for elevated blood sugars. He currently lives in a retirement community and is in charge of his own medications. Per patient his CBG at home this morning was in the 130's but per caregiver it was in the high 300's. Pt reports taking his lantus 44 units this morning. It is unclear of his cognitive ability. He was last admitted on 8/13 for HHS and refused SNF placement at that time. Pt denies SOB, recent illness, sick contacts, chest pain, dysuria, and N/V.   Past Medical History  Diagnosis Date  . Diabetes mellitus   . Hypertension   . Hyperlipidemia   . Arthritis   . Peripheral vascular disease   . Hernia of abdominal wall   . DVT (deep venous thrombosis)   . Cancer     prostate  . Stroke 1998    History of 2 CVA  two weeks apart  . Carotid artery disease   . Coronary artery disease   . CHF (congestive heart failure)     diastolic   Past Surgical History  Procedure Laterality Date  . Prostate surgery      SEED implant  . Carotid endarterectomy  07-27-10    Right CEA  . Carotid endarterectomy  2003    Left CEA redo  . Carotid endarterectomy  1998    Left CEA  . Coronary artery bypass graft  2012    CABG x 3 vessel by Dr. Cyndia Bent  . Shoulder fusion surgery  1980's    for recurrent dislocation  . Foot surgery    . Pr vein bypass graft,aorto-fem-pop  07/27/10  . Tooth extraction  July 2013   Family History  Problem Relation Age of Onset  . Heart disease Father     Heart Disease before age 57  . Heart disease Brother   . Heart attack Brother   . Hypertension Mother   . Heart attack Mother    History  Substance Use Topics  . Smoking status: Former Smoker -- 0.25 packs/day for 50  years    Types: Cigarettes    Quit date: 11/28/2011  . Smokeless tobacco: Former Systems developer    Quit date: 06/27/2011  . Alcohol Use: No    Review of Systems  Constitutional: Negative for fever.  Respiratory: Negative for cough.   Genitourinary: Negative for dysuria.      Allergies  Darvon; Crestor; and Meperidine hcl  Home Medications   Prior to Admission medications   Medication Sig Start Date End Date Taking? Authorizing Provider  acetaminophen (TYLENOL) 325 MG tablet Take 2 tablets (650 mg total) by mouth every 6 (six) hours as needed for mild pain (or Fever >/= 101). 02/11/14  Yes Bobby Rumpf York, PA-C  aspirin 325 MG tablet Take 325 mg by mouth daily.   Yes Historical Provider, MD  hydrALAZINE (APRESOLINE) 25 MG tablet Take 1 tablet (25 mg total) by mouth 3 (three) times daily. 02/11/14  Yes Marianne L York, PA-C  insulin aspart (NOVOLOG) 100 UNIT/ML injection Inject 9 Units into the skin 3 (three) times daily with meals. 02/11/14  Yes Marianne L York, PA-C  insulin glargine (  LANTUS) 100 UNIT/ML injection Inject 0.44 mLs (44 Units total) into the skin daily. 02/11/14  Yes Bobby Rumpf York, PA-C  metoprolol tartrate (LOPRESSOR) 25 MG tablet Take 1 tablet (25 mg total) by mouth 2 (two) times daily. 02/11/14  Yes Marianne L York, PA-C  Multiple Vitamin (MULTIVITAMIN) tablet Take 1 tablet by mouth daily.   Yes Historical Provider, MD  ondansetron (ZOFRAN) 4 MG tablet Take 1 tablet (4 mg total) by mouth every 6 (six) hours as needed for nausea. 02/11/14  Yes Marianne L York, PA-C   BP 153/84  Pulse 100  Temp(Src) 98.7 F (37.1 C) (Oral)  Resp 13  SpO2 94% Physical Exam  Constitutional:  Strong urine odor associated w/ patient   Cardiovascular: Normal rate and regular rhythm.   Pulmonary/Chest: Effort normal and breath sounds normal.  Abdominal: Soft. Bowel sounds are normal. There is no tenderness.  Genitourinary:  Patient wearing a diaper w/ fecal material around genital area.    Skin:  No open wounds or skin breaks noted on overall body skin exam  Psychiatric:  Knew DOB and location    ED Course  Procedures (including critical care time) Labs Review Labs Reviewed  CBC - Abnormal; Notable for the following:    WBC 11.0 (*)    All other components within normal limits  BASIC METABOLIC PANEL - Abnormal; Notable for the following:    Sodium 128 (*)    Potassium 7.1 (*)    Chloride 87 (*)    Glucose, Bld 772 (*)    BUN 37 (*)    GFR calc non Af Amer 78 (*)    All other components within normal limits  URINALYSIS, ROUTINE W REFLEX MICROSCOPIC - Abnormal; Notable for the following:    Glucose, UA >1000 (*)    All other components within normal limits  CBG MONITORING, ED - Abnormal; Notable for the following:    Glucose-Capillary >600 (*)    All other components within normal limits  CBG MONITORING, ED - Abnormal; Notable for the following:    Glucose-Capillary 547 (*)    All other components within normal limits  CBG MONITORING, ED - Abnormal; Notable for the following:    Glucose-Capillary 483 (*)    All other components within normal limits  CULTURE, BLOOD (ROUTINE X 2)  CULTURE, BLOOD (ROUTINE X 2)  POTASSIUM  URINE MICROSCOPIC-ADD ON  BASIC METABOLIC PANEL    Imaging Review No results found.   EKG Interpretation   Date/Time:  Tuesday March 02 2014 11:25:10 EDT Ventricular Rate:  102 PR Interval:  312 QRS Duration: 126 QT Interval:  340 QTC Calculation: 443 R Axis:   148 Text Interpretation:  Sinus tachycardia with 1st degree A-V block Right  bundle branch block Left posterior fascicular block. Abnormal ECG possible  peaked T waves as compared to prior ecg Confirmed by CAMPOS  MD, KEVIN  ([id number]) on 03/02/2014 12:42:58 PM      MDM   Final diagnoses:  None    BMP with elevated glucose, bicarb WNL, and AG of 12. No ketones in urine. Likely pt has HHS 2/2 to medication non compliance vs infection (WBC 11.0).  Initial K+  elevated  at 7.1, which he received 40mg  lasix IV, insulin bolus, and calcium gluconate. Repeat K+ was 5.1. UA negative for UTI. Will get CXR and blood cultures to look for source of infection. Pt made NPO, started 2L bolus of NS and insulin gtt. [facility] to admit patient to step down.  Julious Oka, MD 03/02/14 [phone]

## 2014-03-02 NOTE — ED Notes (Signed)
Pt c/o dysuria, sts that he just recently had a UTI and was admitted to the hospital for hyperglycemia. Pt sts that he has been taking his insulin.

## 2014-03-02 NOTE — ED Notes (Signed)
Notified RN CBG 547

## 2014-03-03 LAB — BASIC METABOLIC PANEL
Anion gap: 14 (ref 5–15)
Anion gap: 8 (ref 5–15)
Anion gap: 8 (ref 5–15)
BUN: 28 mg/dL — ABNORMAL HIGH (ref 6–23)
BUN: 27 mg/dL — AB (ref 6–23)
BUN: 28 mg/dL — AB (ref 6–23)
CALCIUM: 9.2 mg/dL (ref 8.4–10.5)
CALCIUM: 9.6 mg/dL (ref 8.4–10.5)
CO2: 28 mEq/L (ref 19–32)
CO2: 30 mEq/L (ref 19–32)
CO2: 34 mEq/L — ABNORMAL HIGH (ref 19–32)
Calcium: 10 mg/dL (ref 8.4–10.5)
Chloride: 100 mEq/L (ref 96–112)
Chloride: 97 mEq/L (ref 96–112)
Chloride: 97 mEq/L (ref 96–112)
Creatinine, Ser: 0.77 mg/dL (ref 0.50–1.35)
Creatinine, Ser: 0.78 mg/dL (ref 0.50–1.35)
Creatinine, Ser: 0.91 mg/dL (ref 0.50–1.35)
GFR calc Af Amer: >90 mL/min (ref >90)
GFR, EST NON AFRICAN AMERICAN: 80 mL/min — AB (ref >90)
GFR, EST NON AFRICAN AMERICAN: 85 mL/min — AB (ref >90)
GFR, EST NON AFRICAN AMERICAN: 85 mL/min — AB (ref >90)
GLUCOSE: 402 mg/dL — AB (ref 70–99)
GLUCOSE: 110 mg/dL — AB (ref 70–99)
Glucose, Bld: 226 mg/dL — ABNORMAL HIGH (ref 70–99)
POTASSIUM: 5.6 meq/L — AB (ref 3.7–5.3)
Potassium: 4.1 mEq/L (ref 3.7–5.3)
Potassium: 5.2 mEq/L (ref 3.7–5.3)
SODIUM: 139 meq/L (ref 137–147)
Sodium: 135 mEq/L — ABNORMAL LOW (ref 137–147)
Sodium: 142 mEq/L (ref 137–147)

## 2014-03-03 LAB — GLUCOSE, CAPILLARY
GLUCOSE-CAPILLARY: 155 mg/dL — AB (ref 70–99)
GLUCOSE-CAPILLARY: 142 mg/dL — AB (ref 70–99)
GLUCOSE-CAPILLARY: 141 mg/dL — AB (ref 70–99)
GLUCOSE-CAPILLARY: 109 mg/dL — AB (ref 70–99)
Glucose-Capillary: 117 mg/dL — ABNORMAL HIGH (ref 70–99)
Glucose-Capillary: 149 mg/dL — ABNORMAL HIGH (ref 70–99)
Glucose-Capillary: 199 mg/dL — ABNORMAL HIGH (ref 70–99)
Glucose-Capillary: 217 mg/dL — ABNORMAL HIGH (ref 70–99)
Glucose-Capillary: 222 mg/dL — ABNORMAL HIGH (ref 70–99)
Glucose-Capillary: 231 mg/dL — ABNORMAL HIGH (ref 70–99)
Glucose-Capillary: 337 mg/dL — ABNORMAL HIGH (ref 70–99)
Glucose-Capillary: 366 mg/dL — ABNORMAL HIGH (ref 70–99)
Glucose-Capillary: 98 mg/dL (ref 70–99)

## 2014-03-03 MED ORDER — GLUCOSE 40 % PO GEL: 1.0000 | ORAL | Status: DC | PRN

## 2014-03-03 MED ORDER — INSULIN GLARGINE 100 UNIT/ML ~~LOC~~ SOLN
44.0000 [IU] | Freq: Every day | SUBCUTANEOUS | Status: DC
  Administered 2014-03-03 (×2): 44 [IU] via SUBCUTANEOUS
  Filled 2014-03-03 (×2): qty 0.44

## 2014-03-03 MED ORDER — DEXTROSE 50 % IV SOLN: 25.0000 mL | Freq: Once | INTRAVENOUS | Status: DC | PRN

## 2014-03-03 MED ORDER — INSULIN ASPART 100 UNIT/ML ~~LOC~~ SOLN
0.0000 [IU] | Freq: Three times a day (TID) | SUBCUTANEOUS | Status: DC
  Administered 2014-03-03: 15 [IU] via SUBCUTANEOUS
  Administered 2014-03-03: 5 [IU] via SUBCUTANEOUS
  Administered 2014-03-03: 11 [IU] via SUBCUTANEOUS
  Administered 2014-03-04: 3 [IU] via SUBCUTANEOUS
  Administered 2014-03-04: 8 [IU] via SUBCUTANEOUS

## 2014-03-03 MED ORDER — INSULIN ASPART 100 UNIT/ML ~~LOC~~ SOLN
9.0000 [IU] | Freq: Three times a day (TID) | SUBCUTANEOUS | Status: DC
  Administered 2014-03-03 – 2014-03-04 (×3): 9 [IU] via SUBCUTANEOUS

## 2014-03-03 MED ORDER — INSULIN ASPART 100 UNIT/ML ~~LOC~~ SOLN
0.0000 [IU] | Freq: Every day | SUBCUTANEOUS | Status: DC
  Administered 2014-03-03: 2 [IU] via SUBCUTANEOUS

## 2014-03-03 MED ORDER — DEXTROSE 50 % IV SOLN: 50.0000 mL | Freq: Once | INTRAVENOUS | Status: DC | PRN

## 2014-03-03 NOTE — Progress Notes (Signed)
Utilization review completed.  

## 2014-03-03 NOTE — Care Management Note (Signed)
    Page 1 of 1   03/03/2014     11:19:05 AM CARE MANAGEMENT NOTE 03/03/2014  Patient:  [name],[name]   Account Number:  [id number]  Date Initiated:  03/03/2014  Documentation initiated by:  Marvetta Gibbons  Subjective/Objective Assessment:   Pt admitted with hyperglycemia     Action/Plan:   PTA pt lived at [place] spouse. Patient has rolling walker - per caregiver [name] [name]- pt is agreeable to  SNF placement at [place] consulted   Anticipated DC Date:  03/05/2014   Anticipated DC Plan:  [place]  In-house referral  Clinical Social Worker         Choice offered to / List presented to:             Status of service:  In process, will continue to follow Medicare Important Message given?   (If response is "NO", the following Medicare IM given date fields will be blank) Date Medicare IM given:   Medicare IM given by:   Date Additional Medicare IM given:   Additional Medicare IM given by:    Discharge Disposition:    Per UR Regulation:  Reviewed for med. necessity/level of care/duration of stay  If discussed at [place] of Stay Meetings, dates discussed:    Comments:  03/03/14- 1115- Marvetta Gibbons RN, BSN [phone] Pt with recent discharge for same dx- had refused recommendation for SNF placement and instead returned to [facility] with [facility] services- through  [facility] [phone], fax [phone] for home health services which [facility] prefers. [facility] services arranged were- RN/PT/OT/aide/SW-  pt has an aide with [facility]- [name] [name]- [phone] that comes to assist 3x week-- contact at [facility] is -  [name] [name] at [phone]-  per caregiver [name] [name]- pt now agreeable to SNF placement and would prefer [facility]- CSW has been consulted for possible placement- NCM will continue to follow -

## 2014-03-03 NOTE — Progress Notes (Signed)
Trsnsfered Pt to 5W26 vital signs stable 5 [place] RN taking over care. Patient without complaints resting comfortably.

## 2014-03-03 NOTE — Progress Notes (Signed)
Inpatient Diabetes Program Recommendations  AACE/ADA: New Consensus Statement on Inpatient Glycemic Control (2013)  Target Ranges:  Prepandial:   less than 140 mg/dL      Peak postprandial:   less than 180 mg/dL (1-2 hours)      Critically ill patients:  140 - 180 mg/dL     Results for Morozov, Jeronimo A SR. (MRN [id number]) as of 03/03/2014 09:01  Ref. Range 03/02/2014 11:38  Glucose Latest Range: 70-99 mg/dL 772 (HH)    Home DM Meds: Lantus 44 units QHS + Novolog 9 units tid with meals    **Admitted with Hyperglycemia.  Given IVF and started on IV insulin drip.    **Transitioned off IV insulin drip this AM to Lantus 44 units QHS + Novolog Moderate SSI.  Patient received 44 units Lantus at 3am today.  **Note that this DM Coordinator spoke with patient at length during his last admission about his DM regimen at home (see my note from 02/09/14).  Note that patient refused SNF placement during last admission and wound up returning to the [facility].  Per MD notes in H&P, "Patient was accompanied by his caregiver in the ER who strongly feels that patient cannot manage in the independent living facility at this time."    MD- If patient has decent PO intake, may want to start Novolog Meal Coverage- Novolog 4 units tid with meals    Will follow Wyn Quaker RN, MSN, CDE Diabetes Coordinator Inpatient Diabetes Program Team Pager: [phone] (8a-10p)

## 2014-03-03 NOTE — Progress Notes (Signed)
Pt arrived to unit via wheelchair with his belongings from 3s05. Pt was oriented to unit & call bell system. Pt in no apparent distress. Will continue to monitor pt.

## 2014-03-03 NOTE — Evaluation (Signed)
Physical Therapy Evaluation Patient Details Name: [name] [name]. MRN: [id number] DOB: 24-Apr-1935 Today's Date: 03/03/2014   History of Present Illness  Patient is a 79 year old male with history of diabetes, hypertension, prior CVA who presented to ED with increased urinary frequency, elevated blood sugars, sweating. Patient's blood sugar was over 700 the time of arrival  Clinical Impression  Patient demonstrates deficits in mobility as indicated below. Will benefit from continued skilled PT to address deficits and maximize function. Will see as indicated and progress as tolerated.     Follow Up Recommendations SNF;Supervision/Assistance - 24 hour (as family unable to provide assist at this time)    Equipment Recommendations  None recommended by PT    Recommendations for Other Services       Precautions / Restrictions Precautions Precautions: Fall Restrictions Weight Bearing Restrictions: No      Mobility  Bed Mobility Overal bed mobility: Needs Assistance Bed Mobility: Rolling;Sidelying to Sit;Sit to Sidelying Rolling: Supervision Sidelying to sit: Supervision Supine to sit: Supervision   Sit to sidelying: Supervision General bed mobility comments: Patietn able to perform bed mobility without any physical assist  Transfers Overall transfer level: Needs assistance Equipment used: Rolling walker (2 wheeled) Transfers: Sit to/from Stand Sit to Stand: Min guard         General transfer comment: VCs for hand placement and safety with use of RW  Ambulation/Gait Ambulation/Gait assistance: Min guard Ambulation Distance (Feet): 160 Feet Assistive device: Rolling walker (2 wheeled) Gait Pattern/deviations: Step-through pattern;Decreased stride length;Trunk flexed;Narrow base of support Gait velocity: 2.1 ft/sec Gait velocity interpretation: Below normal speed for age/gender General Gait Details: Min guard for safety, patient with some impulsivity  Stairs            Wheelchair Mobility    Modified Rankin (Stroke Patients Only)       Balance     Sitting balance-Leahy Scale: Good Sitting balance - Comments: Able to perform somy dynamic peri-care without assist     Standing balance-Leahy Scale: Poor Standing balance comment: min assist for standing balance during hygiene with one UE supported on rail                             Pertinent Vitals/Pain Pain Assessment: No/denies pain    Home Living Family/patient expects to be discharged to:: Other (Comment) (PTA: [place]) Living Arrangements: Other (Comment) (private caregiver-unsure of hours and duties) Available Help at Discharge: Available PRN/intermittently;Personal care attendant Type of Home: Independent living facility Home Access: Stairs to enter Entrance Stairs-Rails: Right Entrance Stairs-Number of Steps: 4 Home Layout: Multi-level;Able to live on main level with bedroom/bathroom Home Equipment: Gilford Rile - 2 wheels;Cane - single point;Wheelchair - manual Additional Comments: patient poor historian, states that he lives in a house on a lot of land, then states that he lives with his wife at [facility]    Prior Function Level of Independence: Independent with assistive device(s);Needs assistance         Comments: Patient states that "my wife does not want me at home"     Hand Dominance        Extremity/Trunk Assessment               Lower Extremity Assessment: Generalized weakness (Bilateral peripheral neuropathy)         Communication   Communication: HOH  Cognition Arousal/Alertness: Awake/alert Behavior During Therapy: WFL for tasks assessed/performed Overall Cognitive Status: Within Functional Limits  for tasks assessed Area of Impairment: Safety/judgement Orientation Level: Situation       Safety/Judgement: Decreased awareness of safety;Decreased awareness of deficits          General Comments General  comments (skin integrity, edema, etc.): patient incontinence of urine, unable to make it to restroom and urinated across floor and himself. bathing performed with patient assisted for dynamic hygiene. Patient able to perform most self care, with some assist for positioning and stability. Assisted patient with LE care and cues for rest break with fatigue. Patient able to don new gown, needed assist for donning socks. Physiological response monitored throughout bathing. VSS. Fatigue with excersion.     Exercises General Exercises - Lower Extremity Ankle Circles/Pumps: AROM;Both;10 reps Long Arc Quad: AROM;Both;10 reps Other Exercises Other Exercises: AROM for Trunk flexion with assist as patient with difficulty with LE care.      Assessment/Plan    PT Assessment Patient needs continued PT services  PT Diagnosis Generalized weakness;Difficulty walking   PT Problem List Decreased strength;Decreased cognition;Impaired sensation;Decreased activity tolerance;Decreased balance;Decreased safety awareness;Decreased mobility;Decreased knowledge of precautions  PT Treatment Interventions DME instruction;Balance training;Gait training;Stair training;Functional mobility training;Patient/family education;Therapeutic activities;Therapeutic exercise;Cognitive remediation   PT Goals (Current goals can be found in the Care Plan section) Acute Rehab PT Goals Patient Stated Goal: to go home PT Goal Formulation: With patient Time For Goal Achievement: 02/23/14 Potential to Achieve Goals: Fair    Frequency Min 3X/week   Barriers to discharge Decreased caregiver support      Co-evaluation               End of Session Equipment Utilized During Treatment: Gait belt Activity Tolerance: Patient tolerated treatment well Patient left: in chair;with call bell/phone within reach Nurse Communication: Mobility status         Time: 9629-5284 PT Time Calculation (min): 41 min   Charges:   PT  Evaluation $Initial PT Evaluation Tier I: 1 Procedure PT Treatments $Gait Training: 8-22 mins $Therapeutic Activity: 8-22 mins $Self Care/Home Management: 8-22   PT G CodesDuncan Dull 03/03/2014, 12:00 PM Alben Deeds, [place] DPT  [phone]

## 2014-03-03 NOTE — Progress Notes (Signed)
[facility] TEAM 1 - Stepdown/ICU TEAM Progress Note  [name] SHELLHAMMER Sr. ZHY:[id number] DOB: 04-Dec-1934 DOA: 03/02/2014 PCP: Tamsen Roers, MD  Admit HPI / Brief Narrative: 79 year old male with history of uncontrolled diabetes, hypertension, and prior CVA who presented to the ED with increased urinary frequency, and elevated blood sugars. Patient's blood sugar was over 700 at the time of arrival. Patient was recommended for a SNF during a previous admission/discharge on 02/11/14 but he refused, and was therefore d/c to an independent retirement home. Patient was accompanied by his caregiver in the ER who strongly feels that patient cannot manage in the independent living facility at this time.   The patient's HONK has resolved with use of IV insulin, but his CBGs remain too erratic to allow for d/c.  He is therefore being transferred to a medical bed for ongoing titration of his insulin regimen.  PT/OT will be asked to see him in an attempt to convince him that a higher level of care after d/c will be in his best interest.   HPI/Subjective: Pt has no new complaints today. He does report an ongoing problem with generalized weakness.  He denies cp, sob, n/v, or abdom pain.    Assessment/Plan:  Hyperosmolar nonketotic state in setting of uncontrolled DM2 Off insulin gtt, but CBGs remain poorly controlled - adjust tx further and follow trend   HTN BP erratic at present - follow trend w/o change today   HLD Does not appear to be on chronic med tx - check lipid panel in AM  Tobacco abuse  Counseled on need to stop smoking   Hx of CVA  CAD  Chronic diastolic CHF Normal LV function per Dr. Kennon Holter 12/2013 note  Code Status: FULL Family Communication: no family present at time of exam Disposition Plan: transfer to medical bed   Consultants: none  Procedures: none  Antibiotics: none  DVT prophylaxis: SQ heparin   Objective: Blood pressure 153/64, pulse 71, temperature 97.5 F  (36.4 C), temperature source Oral, resp. rate 11, height 5\' 10"  (1.778 m), weight 81.8 kg (180 lb 5.4 oz), SpO2 95.00%.  Intake/Output Summary (Last 24 hours) at 03/03/14 1500 Last data filed at 03/03/14 0808  Gross per 24 hour  Intake 787.28 ml  Output    925 ml  Net -137.72 ml   Exam: General: No acute respiratory distress Lungs: Clear to auscultation bilaterally without wheezes or crackles Cardiovascular: Regular rate and rhythm without murmur gallop or rub normal S1 and S2 Abdomen: Nontender, nondistended, soft, bowel sounds positive, no rebound, no ascites, no appreciable mass Extremities: No significant cyanosis, clubbing, or edema bilateral lower extremities  Data Reviewed: Basic Metabolic Panel:  Recent Labs Lab 03/02/14 1907 03/02/14 2121 03/02/14 2332 03/03/14 0333 03/03/14 1118  NA 140 140 142 139 135*  K 4.0 4.3 4.1 5.6* 5.2  CL 95* 99 100 97 97  CO2 30 29 28  34* 30  GLUCOSE 266* 145* 110* 226* 402*  BUN 30* 29* 28* 28* 27*  CREATININE 0.77 0.75 0.77 0.91 0.78  CALCIUM 10.3 10.0 9.6 10.0 9.2    Liver Function Tests: No results found for this basename: AST, ALT, ALKPHOS, BILITOT, PROT, ALBUMIN,  in the last 168 hours No results found for this basename: LIPASE, AMYLASE,  in the last 168 hours No results found for this basename: AMMONIA,  in the last 168 hours  Coags: No results found for this basename: PT, INR,  in the last 168 hours No results found for this basename:  PTT,  in the last 168 hours  CBC:  Recent Labs Lab 03/02/14 1138  WBC 11.0*  HGB 15.2  HCT 44.6  MCV 92.7  PLT 184   CBG:  Recent Labs Lab 03/03/14 0227 03/03/14 0323 03/03/14 0439 03/03/14 0810 03/03/14 1149  GLUCAP 109* 149* 199* 217* 337*    Recent Results (from the past 240 hour(s))  CULTURE, BLOOD (ROUTINE X 2)     Status: None   Collection Time    03/02/14  3:00 PM      Result Value Ref Range Status   Specimen Description BLOOD ARM LEFT   Final   Special  Requests BOTTLES DRAWN AEROBIC AND ANAEROBIC 10CC   Final   Culture  Setup Time     Final   Value: 03/02/2014 19:13     Performed at [facility]   Culture     Final   Value:        BLOOD CULTURE RECEIVED NO GROWTH TO DATE CULTURE WILL BE HELD FOR 5 DAYS BEFORE ISSUING A FINAL NEGATIVE REPORT     Performed at [facility]   Report Status PENDING   Incomplete  CULTURE, BLOOD (ROUTINE X 2)     Status: None   Collection Time    03/02/14  3:15 PM      Result Value Ref Range Status   Specimen Description BLOOD ARM LEFT   Final   Special Requests BOTTLES DRAWN AEROBIC AND ANAEROBIC 10CC   Final   Culture  Setup Time     Final   Value: 03/02/2014 19:08     Performed at [facility]   Culture     Final   Value:        BLOOD CULTURE RECEIVED NO GROWTH TO DATE CULTURE WILL BE HELD FOR 5 DAYS BEFORE ISSUING A FINAL NEGATIVE REPORT     Performed at [facility]   Report Status PENDING   Incomplete  MRSA PCR SCREENING     Status: None   Collection Time    03/02/14  5:10 PM      Result Value Ref Range Status   MRSA by PCR NEGATIVE  NEGATIVE Final   Comment:            The GeneXpert MRSA Assay (FDA     approved for NASAL specimens     only), is one component of a     comprehensive MRSA colonization     surveillance program. It is not     intended to diagnose MRSA     infection nor to guide or     monitor treatment for     MRSA infections.     Studies:  Recent x-ray studies have been reviewed in detail by the Attending Physician  Scheduled Meds:  Scheduled Meds: . aspirin  325 mg Oral Daily  . heparin subcutaneous  5,000 Units Subcutaneous 3 times per day  . hydrALAZINE  25 mg Oral TID  . insulin aspart  0-15 Units Subcutaneous TID WC  . insulin aspart  0-5 Units Subcutaneous QHS  . insulin glargine  44 Units Subcutaneous QHS  . metoprolol tartrate  25 mg Oral BID  . multivitamin  1 tablet Oral Daily    Time spent on care of this patient: 35  mins   Latarshia Jersey T , MD   [facility] Office  [phone] Pager - Text Page per Shea Evans as per below:  On-Call/Text Page:      [email or web address]  password [email or web address]  If 7PM-7AM, please contact night-coverage [email or web address] Password [email or web address] 03/03/2014, 3:00 PM   LOS: 1 day

## 2014-03-04 DIAGNOSIS — E119 Type 2 diabetes mellitus without complications: Secondary | ICD-10-CM

## 2014-03-04 HISTORY — DX: Type 2 diabetes mellitus without complications: E11.9

## 2014-03-04 LAB — COMPREHENSIVE METABOLIC PANEL
ALT: 15 U/L (ref 0–53)
AST: 18 U/L (ref 0–37)
Albumin: 3.5 g/dL (ref 3.5–5.2)
Alkaline Phosphatase: 102 U/L (ref 39–117)
Anion gap: 11 (ref 5–15)
BUN: 16 mg/dL (ref 6–23)
CALCIUM: 9.4 mg/dL (ref 8.4–10.5)
CO2: 29 meq/L (ref 19–32)
CREATININE: 0.76 mg/dL (ref 0.50–1.35)
Chloride: 100 mEq/L (ref 96–112)
GFR calc non Af Amer: 86 mL/min — ABNORMAL LOW (ref >90)
Glucose, Bld: 237 mg/dL — ABNORMAL HIGH (ref 70–99)
Potassium: 5.3 mEq/L (ref 3.7–5.3)
Sodium: 140 mEq/L (ref 137–147)
Total Bilirubin: 0.4 mg/dL (ref 0.3–1.2)
Total Protein: 6.6 g/dL (ref 6.0–8.3)

## 2014-03-04 LAB — LIPID PANEL
Cholesterol: 163 mg/dL (ref 0–200)
HDL: 29 mg/dL — ABNORMAL LOW (ref >39)
LDL Cholesterol: 68 mg/dL (ref 0–99)
TRIGLYCERIDES: 328 mg/dL — AB (ref <150)
Total CHOL/HDL Ratio: 5.6 RATIO
VLDL: 66 mg/dL — AB (ref 0–40)

## 2014-03-04 LAB — GLUCOSE, CAPILLARY
Glucose-Capillary: 198 mg/dL — ABNORMAL HIGH (ref 70–99)
Glucose-Capillary: 258 mg/dL — ABNORMAL HIGH (ref 70–99)

## 2014-03-04 MED ORDER — INSULIN GLARGINE 100 UNIT/ML ~~LOC~~ SOLN
55.0000 [IU] | Freq: Every day | SUBCUTANEOUS | Status: DC
  Filled 2014-03-04: qty 0.55

## 2014-03-04 MED ORDER — HYDROCHLOROTHIAZIDE 12.5 MG PO TABS: 12.5000 mg | ORAL_TABLET | Freq: Every day | ORAL | Status: AC

## 2014-03-04 MED ORDER — INSULIN ASPART 100 UNIT/ML ~~LOC~~ SOLN: 9.0000 [IU] | Freq: Three times a day (TID) | SUBCUTANEOUS | Status: AC

## 2014-03-04 MED ORDER — INSULIN ASPART 100 UNIT/ML ~~LOC~~ SOLN
3.0000 [IU] | Freq: Three times a day (TID) | SUBCUTANEOUS | Status: DC
Start: 2014-03-04 — End: 2014-03-04

## 2014-03-04 MED ORDER — INSULIN GLARGINE 100 UNIT/ML ~~LOC~~ SOLN: 55.0000 [IU] | Freq: Every day | SUBCUTANEOUS | Status: AC

## 2014-03-04 NOTE — Discharge Summary (Addendum)
Physician Discharge Summary  [name] [name]. GQQ:[id number] DOB: 02-Apr-1935 DOA: 03/02/2014  PCP: Tamsen Roers, MD  Admit date: 03/02/2014 Discharge date: 03/04/2014  Time spent: 35 minutes  Recommendations for Outpatient Follow-up:  1. Follow up with PCP in 4 weeks. 2. Diabetes education as an outpatinet. 3. I have given refill for both insulin.  Discharge Diagnoses:  Principal Problem:   Type 2 diabetes mellitus with hyperosmolar hyperglycemia Active Problems:   CAD   Hyperglycemia   DM (diabetes mellitus), type 2, uncontrolled   Tobacco abuse   Chronic diastolic heart failure   Discharge Condition: stable    Filed Weights   03/02/14 1655 03/03/14 2114  Weight: 81.8 kg (180 lb 5.4 oz) 83.689 kg (184 lb 8 oz)    History of present illness:  79 year old male with history of diabetes, hypertension, prior CVA who presented to ED with increased urinary frequency, elevated blood sugars, sweating. Patient's blood sugar was over 700 the time of arrival. The patient lives at home and unable to manage his insulin. Patient was recommended SNF during the previous admission and was recently discharged on 02/11/14 but he had refused skilled nursing facility. Currently patient lives in an independent retirement home. Patient has CNA's for 3 hours but otherwise he is by himself. He's not been eating or drinking properly either. No recent falls. Patient was accompanied by his caregiver in the ER who strongly feels that patient cannot manage in the independent living facility at this time.      Hospital Course:  Hyperosmolar nonketotic state in setting of uncontrolled DM2: - Due to noncompliance, the patient relates he ran out of his medications. - He was admitted and started on insulin drip as an IV fluids when his blood glucose is close to this he was transitioned to his home regimen. He was previously on metformin before coming into the hospital the patient cannot tell me why he is not on  metformin. - His Lantus was increased to 55 units. - Diabetes educator as an outpatient.  HTN  - Blood pressure was uncontrolled. - He's only on hydralazine and not a diuretic. We will have him start him on hydrochlorothiazide and followup with PCP and check a basic metabolic panel in 2 weeks.  Tobacco abuse  Counseled on need to stop smoking   Hx of CVA   CAD   Chronic diastolic CHF  Normal LV function per Dr. Kennon Holter 12/2013 note    Procedures:  Chest x-ray  Consultations:  None  Discharge Exam: Filed Vitals:   03/04/14 0601  BP: 165/79  Pulse: 74  Temp:   Resp:     General: Alert awake and oriented x3 Cardiovascular: Regular rate and rhythm Respiratory: Good air movement clear to auscultation  Discharge Instructions You were cared for by a hospitalist during your hospital stay. If you have any questions about your discharge medications or the care you received while you were in the hospital after you are discharged, you can call the unit and asked to speak with the hospitalist on call if the hospitalist that took care of you is not available. Once you are discharged, your primary care physician will handle any further medical issues. Please note that NO REFILLS for any discharge medications will be authorized once you are discharged, as it is imperative that you return to your primary care physician (or establish a relationship with a primary care physician if you do not have one) for your aftercare needs so that they can reassess your  need for medications and monitor your lab values.  Discharge Instructions   Diet - low sodium heart healthy    Complete by:  As directed      Increase activity slowly    Complete by:  As directed           Current Discharge Medication List    START taking these medications   Details  hydrochlorothiazide (HYDRODIURIL) 12.5 MG tablet Take 1 tablet (12.5 mg total) by mouth daily. Qty: 30 tablet, Refills: 3      CONTINUE these  medications which have CHANGED   Details  insulin aspart (NOVOLOG) 100 UNIT/ML injection Inject 9 Units into the skin 3 (three) times daily with meals. Qty: 10 mL, Refills: 11    insulin glargine (LANTUS) 100 UNIT/ML injection Inject 0.55 mLs (55 Units total) into the skin at bedtime. Qty: 10 mL, Refills: 11      CONTINUE these medications which have NOT CHANGED   Details  acetaminophen (TYLENOL) 325 MG tablet Take 2 tablets (650 mg total) by mouth every 6 (six) hours as needed for mild pain (or Fever >/= 101).    aspirin 325 MG tablet Take 325 mg by mouth daily.    hydrALAZINE (APRESOLINE) 25 MG tablet Take 1 tablet (25 mg total) by mouth 3 (three) times daily. Qty: 90 tablet, Refills: 6    metoprolol tartrate (LOPRESSOR) 25 MG tablet Take 1 tablet (25 mg total) by mouth 2 (two) times daily. Qty: 60 tablet, Refills: 6    Multiple Vitamin (MULTIVITAMIN) tablet Take 1 tablet by mouth daily.    ondansetron (ZOFRAN) 4 MG tablet Take 1 tablet (4 mg total) by mouth every 6 (six) hours as needed for nausea. Qty: 20 tablet, Refills: 0       Allergies  Allergen Reactions  . Darvon [Propoxyphene Hcl] Other (See Comments)    Severe sweating  . Crestor [Rosuvastatin Calcium] Nausea And Vomiting  . Meperidine Hcl Other (See Comments)    Passes out   Follow-up Information   Follow up with LITTLE,JAMES, MD In 2 weeks. (hospital follow up)    Specialty:  Family Medicine   Contact information:   [address] [place]  [address] [phone]        The results of significant diagnostics from this hospitalization (including imaging, microbiology, ancillary and laboratory) are listed below for reference.    Significant Diagnostic Studies: Dg Chest 2 View  03/02/2014   CLINICAL DATA:  Dizziness  EXAM: CHEST  2 VIEW  COMPARISON:  02/08/2014  FINDINGS: Cardiomediastinal silhouette is stable. Status post CABG. No acute infiltrate or pleural effusion. No pulmonary edema. Stable  degenerative changes thoracic spine.  IMPRESSION: No active cardiopulmonary disease.  No significant change.   Electronically Signed   By: Lahoma Crocker M.D.   On: 03/02/2014 15:44   Dg Chest 2 View  02/08/2014   CLINICAL DATA:  Hyperglycemia.  Diabetes.  Hypertension.  EXAM: CHEST  2 VIEW  COMPARISON:  02/01/2014  FINDINGS: Prior CABG. Atherosclerotic aortic arch. Stable slightly lobular left hemidiaphragm. Degenerative glenohumeral arthropathy on the left with a left-sided scapular screw.  Thoracic spondylosis.  No pleural effusion identified.  IMPRESSION: 1. Prior CABG, without cardiomegaly or edema. 2. Overall stable appearance of the chest without acute findings.   Electronically Signed   By: Sherryl Barters M.D.   On: 02/08/2014 19:06    Microbiology: Recent Results (from the past 240 hour(s))  CULTURE, BLOOD (ROUTINE X 2)  Status: None   Collection Time    03/02/14  3:00 PM      Result Value Ref Range Status   Specimen Description BLOOD ARM LEFT   Final   Special Requests BOTTLES DRAWN AEROBIC AND ANAEROBIC 10CC   Final   Culture  Setup Time     Final   Value: 03/02/2014 19:13     Performed at [facility]   Culture     Final   Value:        BLOOD CULTURE RECEIVED NO GROWTH TO DATE CULTURE WILL BE HELD FOR 5 DAYS BEFORE ISSUING A FINAL NEGATIVE REPORT     Performed at [facility]   Report Status PENDING   Incomplete  CULTURE, BLOOD (ROUTINE X 2)     Status: None   Collection Time    03/02/14  3:15 PM      Result Value Ref Range Status   Specimen Description BLOOD ARM LEFT   Final   Special Requests BOTTLES DRAWN AEROBIC AND ANAEROBIC 10CC   Final   Culture  Setup Time     Final   Value: 03/02/2014 19:08     Performed at [facility]   Culture     Final   Value:        BLOOD CULTURE RECEIVED NO GROWTH TO DATE CULTURE WILL BE HELD FOR 5 DAYS BEFORE ISSUING A FINAL NEGATIVE REPORT     Performed at [facility]   Report Status PENDING   Incomplete   MRSA PCR SCREENING     Status: None   Collection Time    03/02/14  5:10 PM      Result Value Ref Range Status   MRSA by PCR NEGATIVE  NEGATIVE Final   Comment:            The GeneXpert MRSA Assay (FDA     approved for NASAL specimens     only), is one component of a     comprehensive MRSA colonization     surveillance program. It is not     intended to diagnose MRSA     infection nor to guide or     monitor treatment for     MRSA infections.     Labs: Basic Metabolic Panel:  Recent Labs Lab 03/02/14 1907 03/02/14 2121 03/02/14 2332 03/03/14 0333 03/03/14 1118  NA 140 140 142 139 135*  K 4.0 4.3 4.1 5.6* 5.2  CL 95* 99 100 97 97  CO2 30 29 28  34* 30  GLUCOSE 266* 145* 110* 226* 402*  BUN 30* 29* 28* 28* 27*  CREATININE 0.77 0.75 0.77 0.91 0.78  CALCIUM 10.3 10.0 9.6 10.0 9.2   Liver Function Tests: No results found for this basename: AST, ALT, ALKPHOS, BILITOT, PROT, ALBUMIN,  in the last 168 hours No results found for this basename: LIPASE, AMYLASE,  in the last 168 hours No results found for this basename: AMMONIA,  in the last 168 hours CBC:  Recent Labs Lab 03/02/14 1138  WBC 11.0*  HGB 15.2  HCT 44.6  MCV 92.7  PLT 184   Cardiac Enzymes: No results found for this basename: CKTOTAL, CKMB, CKMBINDEX, TROPONINI,  in the last 168 hours BNP: BNP (last 3 results)  Recent Labs  02/09/14 1913  PROBNP 169.8   CBG:  Recent Labs Lab 03/03/14 1149 03/03/14 1612 03/03/14 2112 03/03/14 2216 03/04/14 0803  GLUCAP 337* 366* 231* 222* 198*  Signed:  Charlynne Cousins  [facility] 03/04/2014, 8:23 AM

## 2014-03-04 NOTE — Progress Notes (Signed)
OT Cancellation Note  Patient Details Name: GRACE HAGGART Sr. MRN: [id number] DOB: Dec 17, 1934   Cancelled Treatment:    Reason Eval/Treat Not Completed: Other (comment).  Noted pt is being transferred to [facility] today.  Will defer OT eval to that venue.  Elena Cothern 03/04/2014, 1:59 PM Lesle Chris, OTR/L [phone] 03/04/2014

## 2014-03-04 NOTE — Clinical Social Work Psychosocial (Signed)
Clinical Social Work Department BRIEF PSYCHOSOCIAL ASSESSMENT 03/04/2014  Patient:  [name] [name],[name] [name]     Account Number:  [id number]     Admit date:  03/02/2014  Clinical Social Worker:  Lovey Newcomer  Date/Time:  03/04/2014 11:00 AM  Referred by:  Physician  Date Referred:  03/04/2014 Referred for  SNF Placement   Other Referral:   Interview type:  Patient Other interview type:   Patient alert and oriented at time of assessment. Patient's caregiver Lattie Haw and son Grayland Ormond also spoken to about disposition.    PSYCHOSOCIAL DATA Living Status:  FACILITY Admitted from facility:  Other Level of care:  Independent Living Primary support name:  Grayland Ormond Primary support relationship to patient:  CHILD, ADULT Degree of support available:   Support is good.    CURRENT CONCERNS Current Concerns  Post-Acute Placement   Other Concerns:   NA    SOCIAL WORK ASSESSMENT / PLAN CSW met with patient at bedside to complete assessment. Patient states that he plans to discharge to [facility] instead of returning to independent living. Patient's caregivers report that his care needs exceed their abilities. Patient's son Grayland Ormond is in agreement with plan for discharge to [facility]. Patient appears indifferent about SNF placement, unlike during his previous admission where he adamently refused SNF placement. CSW explained SNF search/placement process and answered questions. CSW will assist.   Assessment/plan status:  Psychosocial Support/Ongoing Assessment of Needs Other assessment/ plan:   Complete Fl2, Fax, PASRR   Information/referral to community resources:   CSW contact information and SNF list given.    PATIENT'S/FAMILY'S RESPONSE TO PLAN OF CARE: Patient and family agreeable to SNF placement at discharge. CSW will assist when appropriate.     Liz Beach MSW, [place], [place], [phone]

## 2014-03-04 NOTE — Progress Notes (Signed)
Called report to [facility] facility to transfer the patient. Patient transported by [facility].

## 2014-03-04 NOTE — Progress Notes (Signed)
Inpatient Diabetes Program Recommendations  AACE/ADA: New Consensus Statement on Inpatient Glycemic Control (2013)  Target Ranges:  Prepandial:   less than 140 mg/dL      Peak postprandial:   less than 180 mg/dL (1-2 hours)      Critically ill patients:  140 - 180 mg/dL   Reason for Visit: discharging to SNF  Diabetes history: Type 2 Outpatient Diabetes medications: being discharged home with 55 units of Lantus and 9 units of Humalog tid with meals   Spoke with patient regarding discharge and insulin management.  Patient has avoided using his abdomen for insulin administration because of an abdominal hernia. Because of this, he has been using the inner aspect of his left leg, just above his knee.  There is a hard lump in this area.  I have encouraged him to rotate his insulin administration and have encouraged him to discuss this with the staff at [facility].  I have shown him the areas that are safe to use and given him a pamphlet that identifies the sites for insulin administration. I have also encouraged him to use his abdomen (flank) as one of the sites for administration.  I reviewed the treatment of low blood sugars for two reasons; Lantus dose has increased to 55 units (from 44 units) and the absorption of the insulin will likely be greater once he moves the insulin to different sites. I have left information about the symptoms of low blood sugars and the treatment and have asked him to have his son get glucose tablets and/or candies to leave at the bedside, should he have a low blood sugar.  He has not had a low blood sugar in a long time but states he becomes nervous when he does have a low.  Gentry Fitz, RN, BA, MHA, CDE Diabetes Coordinator Inpatient Diabetes Program  [phone] (Team Pager) [phone] [facility]) 03/04/2014 12:27 PM

## 2014-03-04 NOTE — Clinical Social Work Placement (Signed)
Clinical Social Work Department CLINICAL SOCIAL WORK PLACEMENT NOTE 03/04/2014  Patient:  [name] [name],[name] [name]  Account Number:  [id number] Admit date:  03/02/2014  Clinical Social Worker:  Lovey Newcomer  Date/time:  03/04/2014 01:38 PM  Clinical Social Work is seeking post-discharge placement for this patient at the following level of care:   SKILLED NURSING   (*CSW will update this form in Epic as items are completed)   03/04/2014  Patient/family provided with [place] Department of Clinical Social Work's list of facilities offering this level of care within the geographic area requested by the patient (or if unable, by the patient's family).  03/04/2014  Patient/family informed of their freedom to choose among providers that offer the needed level of care, that participate in Medicare, Medicaid or managed care program needed by the patient, have an available bed and are willing to accept the patient.  03/04/2014  Patient/family informed of [facility]' ownership interest in [facility], as well as of the fact that they are under no obligation to receive care at this facility.  PASARR submitted to EDS on  PASARR number received on   FL2 transmitted to all facilities in geographic area requested by pt/family on  03/04/2014 FL2 transmitted to all facilities within larger geographic area on   Patient informed that his/her managed care company has contracts with or will negotiate with  certain facilities, including the following:     Patient/family informed of bed offers received:  03/04/2014 Patient chooses bed at [facility] [place] Physician recommends and patient chooses bed at    Patient to be transferred to [facility] on  03/04/2014 Patient to be transferred to facility by Ambulance Patient and family notified of transfer on 03/04/2014 Name of family member notified:  Grayland Ormond and Lattie Haw  The following  physician request were entered in Epic:   Additional Comments:   Per MD patient ready for DC to [facility]. RN, patient, patient's family, and facility notified of DC. RN given number for report. DC packet on chart. [facility] transport requested for patient. CSW signing off.    Liz Beach MSW, [place], [place], [phone]

## 2014-03-08 LAB — CULTURE, BLOOD (ROUTINE X 2)
CULTURE: NO GROWTH
Culture: NO GROWTH

## 2014-03-11 ENCOUNTER — Telehealth: Payer: Self-pay | Admitting: Cardiovascular Disease

## 2014-03-11 NOTE — Telephone Encounter (Signed)
Returned a call to [place]. She wanted to know if Dr. Gwenlyn Found would sign home health orders for this patient. Patient was ordered to get some PT and he also has had 2 nurse visits.she says that the service was ordered at the patients last hospitalization. (not by Dr. Gwenlyn Found) she called Dr. Felipe Drone office for signature but says that according to her records he is not  PECO  Certified. She does however tell me that Dr. Eddie Dibbles office tells her that he is PECO  Certified. I informed her that since Dr. Gwenlyn Found is not the ordering MD she will need to have Dr. Eddie Dibbles office to sign the orders. I assured her that his office does know whether he is able to sign orders or not. She thanked me for returning her call stating they were just "grasping for straws."

## 2014-03-11 NOTE — Telephone Encounter (Signed)
[name] [name] called in wanting to know if Dr. Gwenlyn Found would sign off on some home health orders for this pt. Please call  Thanks

## 2014-07-09 IMAGING — CT CT HEAD W/O CM
2 series · 16 of 30 positions shown, 19 images · non-contrast
Comparison: None.

CLINICAL DATA: Multiple falls over the last 2-3 weeks.

EXAM:
CT HEAD WITHOUT CONTRAST
TECHNIQUE: Contiguous axial images were obtained from the base of the skull
through the vertex without intravenous contrast.

[Series 2: head w/o · axial · non-contrast · 0.49mm/px · z∈[-175,-60]mm · 9 of 29 slices shown, 12 images]
[im 3/29  brain]
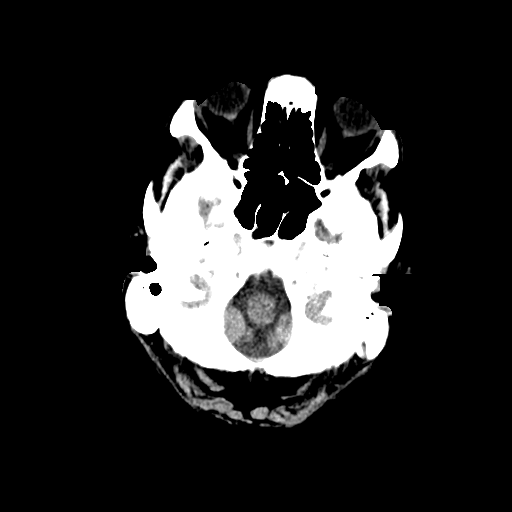
[im 3/29  bone]
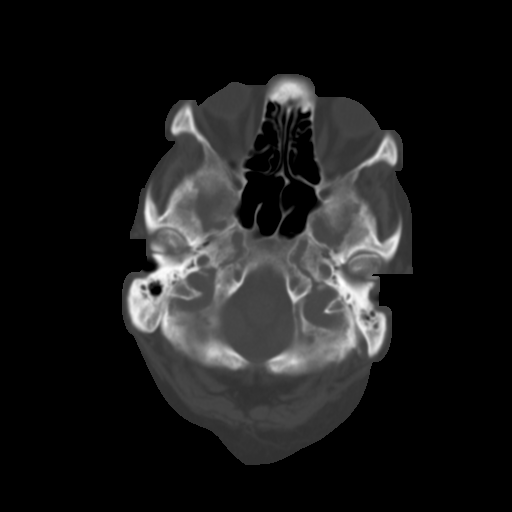
[im 6/29  brain]
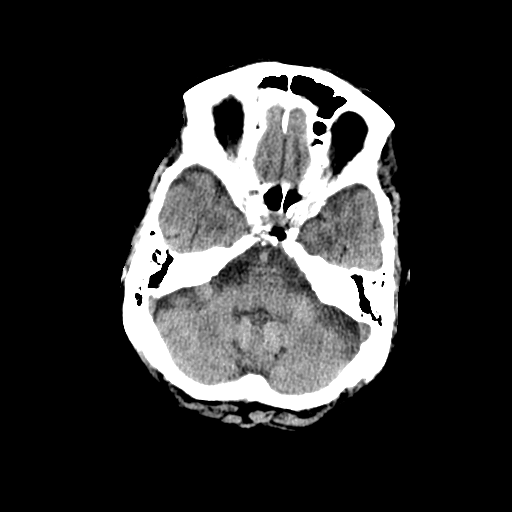
[im 9/29  brain]
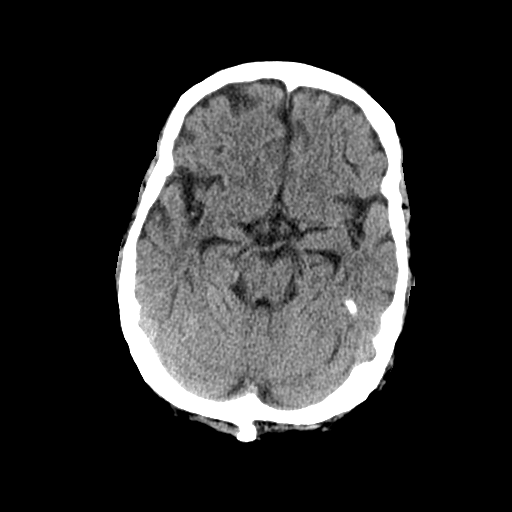
[im 12/29  brain]
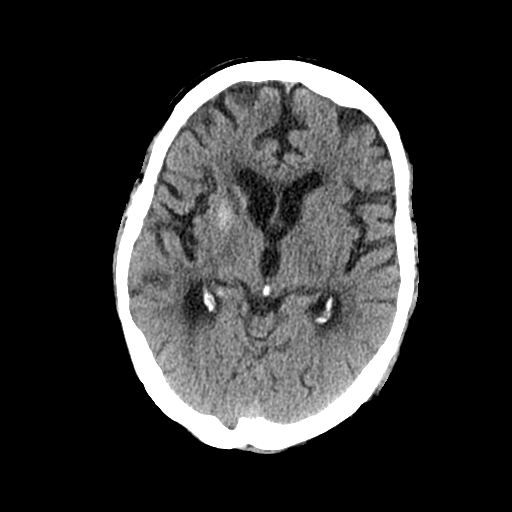
[im 15/29  brain]
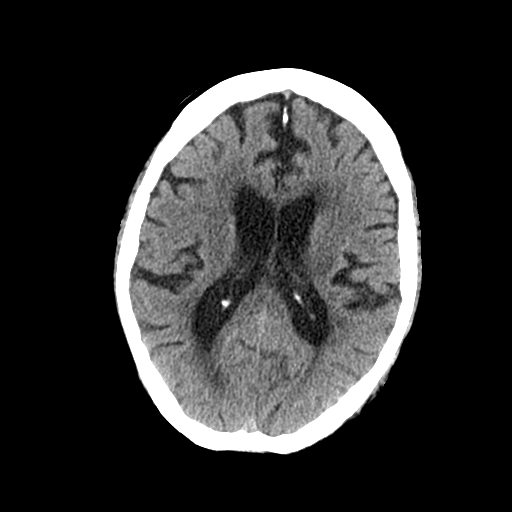
[im 15/29  bone]
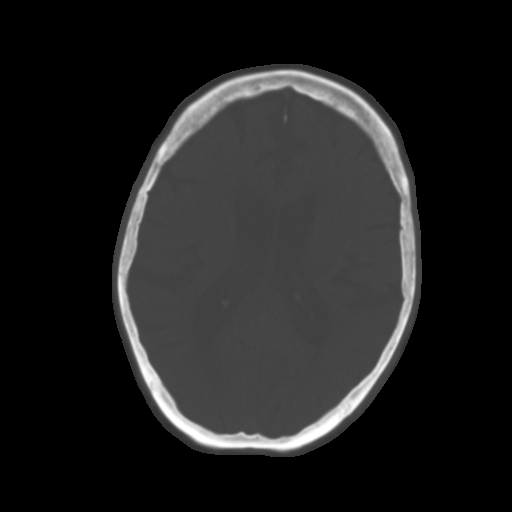
[im 17/29  brain]
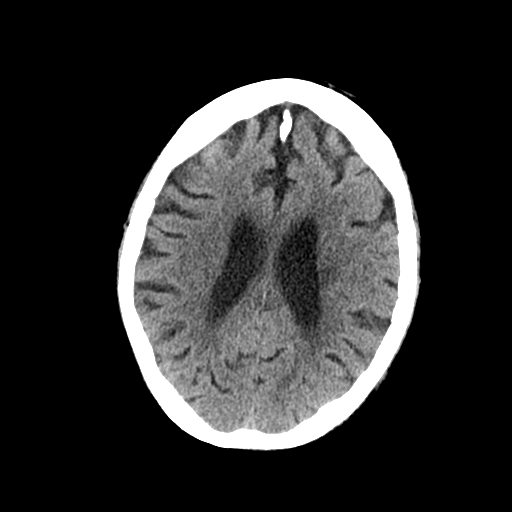
[im 20/29  brain]
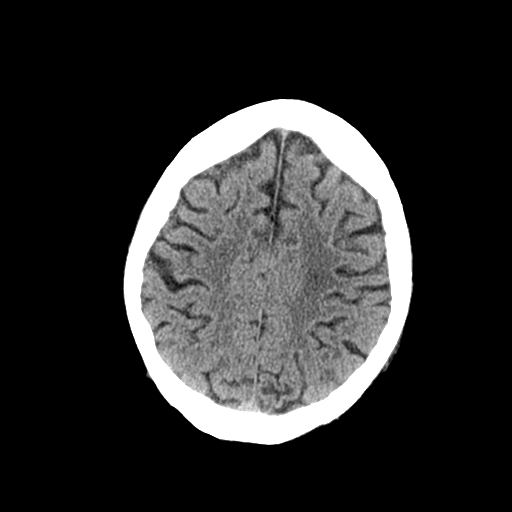
[im 23/29  brain]
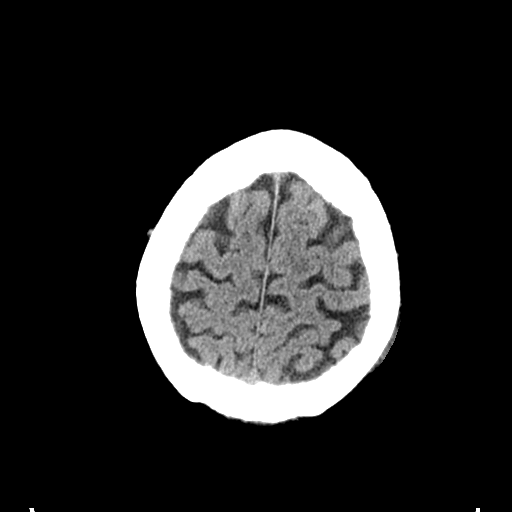
[im 26/29  brain]
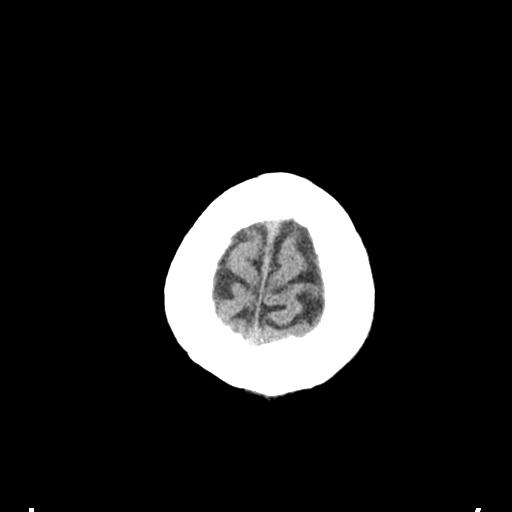
[im 26/29  bone]
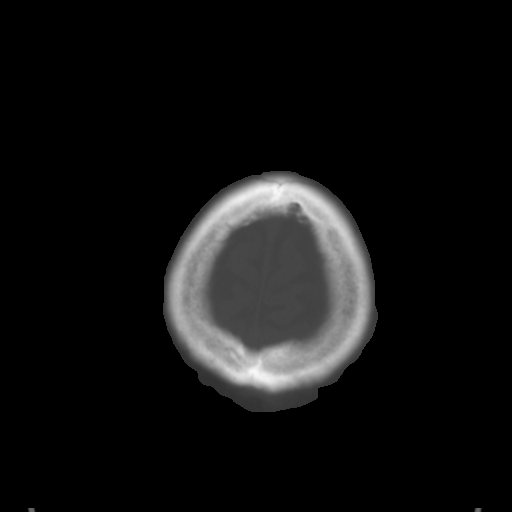

[Series 3: bone windows · axial · 0.49mm/px · z∈[-170,-77]mm · 7 of 48 slices shown]
[im 6/48  bone]
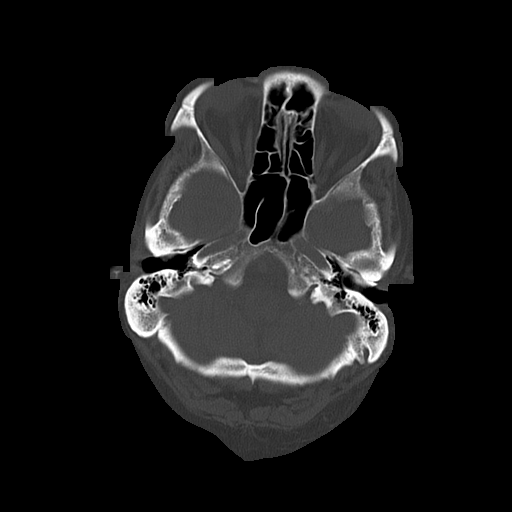
[im 11/48  bone]
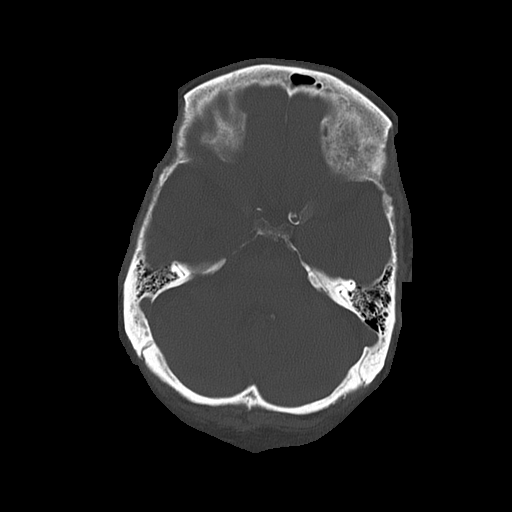
[im 16/48  bone]
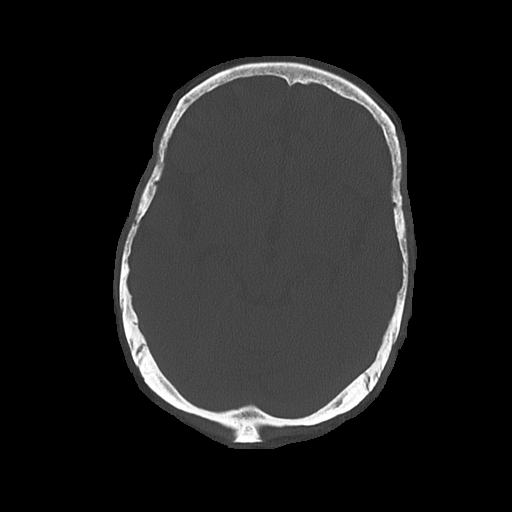
[im 21/48  bone]
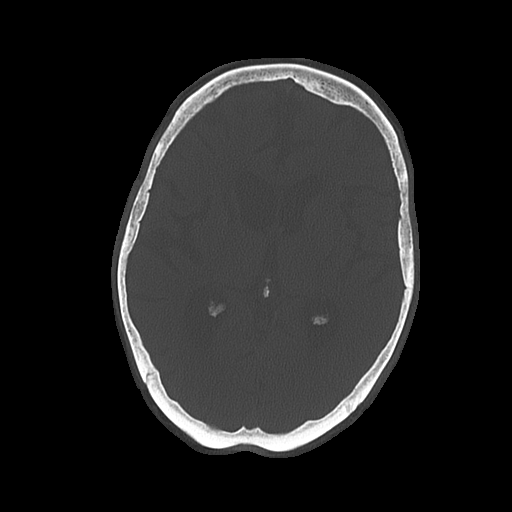
[im 27/48  bone]
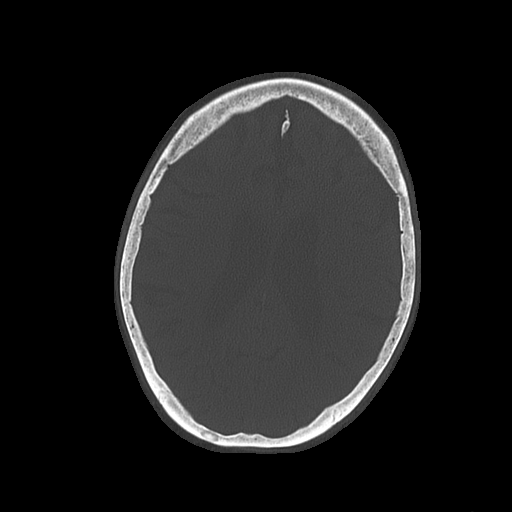
[im 32/48  bone]
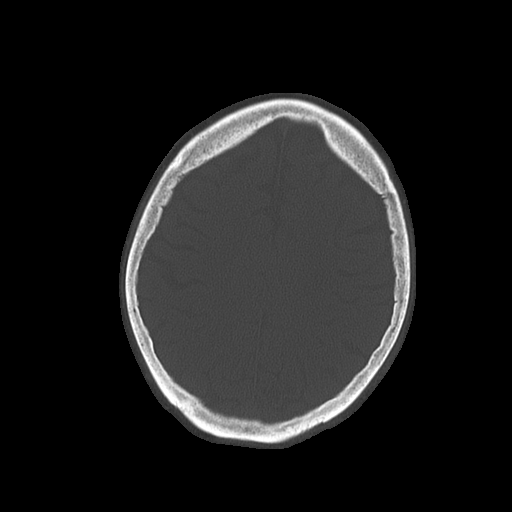
[im 37/48  bone]
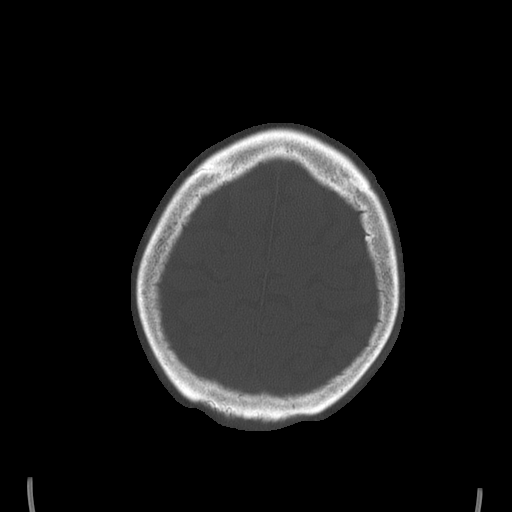

[16 of 30 positions shown; findings below may reference images not displayed]

FINDINGS: There is no evidence of mass effect, midline shift, or extra-axial
fluid collections. There is no evidence of a space-occupying lesion
or intracranial hemorrhage. There is no evidence of a cortical-based
area of acute infarction. There is an old right basal ganglia
lacunar infarct. There is mineralization of the right putaminal.
There is generalized cerebral atrophy. There is periventricular
white matter low attenuation likely secondary to microangiopathy.

The ventricles and sulci are appropriate for the patient's age. The
basal cisterns are patent.

Visualized portions of the orbits are unremarkable. The visualized
portions of the paranasal sinuses and mastoid air cells are
unremarkable. Cerebrovascular atherosclerotic calcifications are
noted.

The osseous structures are unremarkable.
IMPRESSION: No acute intracranial pathology.

## 2014-09-29 ENCOUNTER — Encounter: Payer: Self-pay | Admitting: Neurology

## 2014-09-29 ENCOUNTER — Ambulatory Visit (INDEPENDENT_AMBULATORY_CARE_PROVIDER_SITE_OTHER): Payer: Medicare Other | Admitting: Neurology

## 2014-09-29 VITALS — BP 146/72 | Temp 97.6°F | Ht 70.0 in

## 2014-09-29 DIAGNOSIS — R270 Ataxia, unspecified: Secondary | ICD-10-CM

## 2014-09-29 DIAGNOSIS — R42 Dizziness and giddiness: Secondary | ICD-10-CM

## 2014-09-29 DIAGNOSIS — R278 Other lack of coordination: Secondary | ICD-10-CM | POA: Diagnosis not present

## 2014-09-29 DIAGNOSIS — H532 Diplopia: Secondary | ICD-10-CM | POA: Diagnosis not present

## 2014-09-29 DIAGNOSIS — M316 Other giant cell arteritis: Secondary | ICD-10-CM

## 2014-09-29 DIAGNOSIS — I639 Cerebral infarction, unspecified: Secondary | ICD-10-CM | POA: Diagnosis not present

## 2014-09-29 NOTE — Patient Instructions (Signed)
Overall you are doing fairly well but I do want to suggest a few things today:   Remember to drink plenty of fluid, eat healthy meals and do not skip any meals. Try to eat protein with a every meal and eat a healthy snack such as fruit or nuts in between meals. Try to keep a regular sleep-wake schedule and try to exercise daily, particularly in the form of walking, 20-30 minutes a day, if you can.   As far as your medications are concerned, I would like to suggest: continue aspirin and current medications  As far as diagnostic testing: MRI brain, MRA head, Carotids, labs  I would like to see you back in 3 months, sooner if we need to. Please call us with any interim questions, concerns, problems, updates or refill requests.   Please also call us for any test results so we can go over those with you on the phone.  My clinical assistant and will answer any of your questions and relay your messages to me and also relay most of my messages to you.   Our phone number is [phone]. We also have an after hours call service for urgent matters and there is a physician on-call for urgent questions. For any emergencies you know to call 911 or go to the nearest emergency room

## 2014-09-29 NOTE — Progress Notes (Signed)
[facility]    Provider:  Dr Jaynee Eagles Referring Provider: Tamsen Roers, MD Primary Care Physician:  Tamsen Roers, MD  CC:  Double vision  HPI:  [name] Keens Sr. is a 80 y.o. male here as a referral from Dr. Rex Kras for diplopia. He has a PMHx of HTN, CAD, DM2, Thrombocytopenia, TIA, PVD, alcohol dependence in remission, nicotine dependence. He is here alone, poor historian. He had had recents falls.  If he looks off at a distance he sees double vision, side by side and slightly one on top of the other. It is better now. It started a week ago. When he stands up, he gets dizzy and throws up. The room doesn't spin. The dizziness started with the double vision. No trauma, hasn't hit his head. Sees color fine, no pain on eye movement. Vision isn't blurry, feels like he sees very well. No headaches. No temple pain. No pain on jaw claudication. No stiffness in the shoulders. Concentrating on something close makes the double vision better, but looking at a distance makes it worse. Has had 2 strokes.  Not dependent on time of day, no droopy eyelids. No worsening of vision at the end of the day. No headache. No other new focal neurologic deficits.  Double vision resolves if he closes one eye.   Reviewed notes, labs and imaging from outside physicians, which showed: CMP unremarkable, LDL 83, carotid ultrasound s/p bilateral carotd endardectomy,     Ct head 09/2013 showed No acute intracranial abnormalities including mass lesion or mass effect, hydrocephalus, extra-axial fluid collection, midline shift, hemorrhage, or acute infarction, large ischemic events (personally reviewed images)   Review of Systems: Patient complains of symptoms per HPI as well as the following symptoms: falls. Pertinent negatives per HPI. All others negative.   History   Social History  . Marital Status: Married    Spouse Name: N/A  . Number of Children: N/A  . Years of Education: N/A   Occupational  History  . Not on file.   Social History Main Topics  . Smoking status: Former Smoker -- 0.25 packs/day for 50 years    Types: Cigarettes    Quit date: 11/28/2011  . Smokeless tobacco: Former Systems developer    Quit date: 06/27/2011  . Alcohol Use: No  . Drug Use: No  . Sexual Activity: No   Other Topics Concern  . Not on file   Social History Narrative  . No narrative on file    Family History  Problem Relation Age of Onset  . Heart disease Father     Heart Disease before age 58  . Heart disease Brother   . Heart attack Brother   . Hypertension Mother   . Heart attack Mother     Past Medical History  Diagnosis Date  . Diabetes mellitus   . Hypertension   . Hyperlipidemia   . Arthritis   . Peripheral vascular disease   . Hernia of abdominal wall   . DVT (deep venous thrombosis)   . Cancer     prostate  . Stroke 1998    History of 2 CVA  two weeks apart  . Carotid artery disease   . Coronary artery disease   . CHF (congestive heart failure)     diastolic    Past Surgical History  Procedure Laterality Date  . Prostate surgery      SEED implant  . Carotid endarterectomy  07-27-10    Right CEA  . Carotid endarterectomy  2003  Left CEA redo  . Carotid endarterectomy  1998    Left CEA  . Coronary artery bypass graft  2012    CABG x 3 vessel by Dr. Cyndia Bent  . Shoulder fusion surgery  1980's    for recurrent dislocation  . Foot surgery    . Pr vein bypass graft,aorto-fem-pop  07/27/10  . Tooth extraction  July 2013    Current Outpatient Prescriptions  Medication Sig Dispense Refill  . hydrochlorothiazide (HYDRODIURIL) 12.5 MG tablet Take 1 tablet (12.5 mg total) by mouth daily. (Patient taking differently: Take 25 mg by mouth daily. ) 30 tablet 3  . insulin aspart (NOVOLOG) 100 UNIT/ML injection Inject 9 Units into the skin 3 (three) times daily with meals. 10 mL 11  . insulin glargine (LANTUS) 100 UNIT/ML injection Inject 0.55 mLs (55 Units total) into the skin at  bedtime. 10 mL 11  . acetaminophen (TYLENOL) 325 MG tablet Take 2 tablets (650 mg total) by mouth every 6 (six) hours as needed for mild pain (or Fever >/= 101).    Marland Kitchen aspirin 325 MG tablet Take 325 mg by mouth daily.    . hydrALAZINE (APRESOLINE) 25 MG tablet Take 1 tablet (25 mg total) by mouth 3 (three) times daily. 90 tablet 6  . metoprolol tartrate (LOPRESSOR) 25 MG tablet Take 1 tablet (25 mg total) by mouth 2 (two) times daily. 60 tablet 6  . Multiple Vitamin (MULTIVITAMIN) tablet Take 1 tablet by mouth daily.    . ondansetron (ZOFRAN) 4 MG tablet Take 1 tablet (4 mg total) by mouth every 6 (six) hours as needed for nausea. 20 tablet 0   No current facility-administered medications for this visit.    Allergies as of 09/29/2014 - Review Complete 09/29/2014  Allergen Reaction Noted  . Darvon [propoxyphene hcl] Other (See Comments) 08/26/2012  . Crestor [rosuvastatin calcium] Nausea And Vomiting 08/21/2011  . Meperidine hcl Other (See Comments)     Vitals: BP 146/72 mmHg  Temp(Src) 97.6 F (36.4 C)  Ht 5\' 10"  (1.778 m)  Wt  Last Weight:  Wt Readings from Last 1 Encounters:  03/03/14 184 lb 8 oz (83.689 kg)   Last Height:   Ht Readings from Last 1 Encounters:  09/29/14 5\' 10"  (1.778 m)    Physical exam: Exam: Gen: NAD, conversant, well nourised, obese, well groomed                     CV: MRG. No Carotid Bruits. No peripheral edema, warm, nontender Eyes: subconjunctival hemorrhage right eye, lacrimation bilaterally  Neuro: Detailed Neurologic Exam  Speech:    Speech is normal; fluent and spontaneous with normal comprehension.  Cognition:    The patient is oriented to person, says it is November, doesn't know the day or date, 2016    recent memory impaired, remote appears intact;     language fluent;     normal attention, concentration,     fund of knowledge appears grossly intact, we spoke about the presidential campaign and he appeared to know details Cranial  Nerves:    The pupils are equal, round, and reactive to light. The fundi are flat. Visual fields are full to finger confrontation. Impaired upgaze bilaterall. Impaired right eye abduction.. Saccadic pursuit with end nystagmus. Trigeminal sensation is intact and the muscles of mastication are normal. The face is symmetric. The palate elevates in the midline. Hearing grossly intact. Voice is normal. Shoulder shrug is normal. The tongue has normal motion without fasciculations.  Coordination:    Significant ataxia left arm, milder with the left leg. Intact ftn/hts on the right.  Gait:    Attempted, stood up and felt dizzy and had to sit down  Motor Observation:    No asymmetry, no atrophy, and no involuntary movements noted. Tone:    Normal muscle tone.    Posture:    Posture is normal. normal erect    Strength:    Strength is V/V in the upper and lower limbs.      Sensation: intact to LT     Reflex Exam:  DTR's: Absent patellars    Deep tendon reflexes in the upper and lower extremities are symmetrical bilaterally.   Toes:    The toes are equivocal bilaterally.   Clonus:    Clonus is absent.      Assessment/Plan:  80 year old male with  a PMHx of HTN, CAD, DM2, Thrombocytopenia, TIA, PVD, alcohol dependence in remission, nicotine dependence. P/w new onset binocular horizontal diplopia worse at a distance, better close by, worse when looking to the right. Exam with incomplete right eye abduction which is c/w his symptoms. Impaired upgaze bilaterally which happens with age so unclear if part of his new deficits or chronic. Given vascular risk factors needs an MRi of his brain and MRA of the head. Symptoms do not appear to be c/w with myasthenia gravis.   If this is a sixth nerve palsy, Poorly controlled diabetes is a predisposing factor. Can occur due to head trauma(patient with falls). MRI of the brain to check for  Ischemic or other lesion, MRA for aneurysm or other vascular  anomalies.  Sarina Ill, MD  [facility] [address] [place], [place] [address]  Phone [phone] Fax [phone]

## 2014-09-30 ENCOUNTER — Telehealth: Payer: Self-pay | Admitting: *Deleted

## 2014-09-30 LAB — COMPREHENSIVE METABOLIC PANEL
ALK PHOS: 114 IU/L (ref 39–117)
ALT: 62 IU/L — AB (ref 0–44)
AST: 38 IU/L (ref 0–40)
Albumin/Globulin Ratio: 1.9 (ref 1.1–2.5)
Albumin: 3.9 g/dL (ref 3.5–4.8)
BUN/Creatinine Ratio: 18 (ref 10–22)
BUN: 16 mg/dL (ref 8–27)
Bilirubin Total: 0.5 mg/dL (ref 0.0–1.2)
CHLORIDE: 100 mmol/L (ref 97–108)
CO2: 23 mmol/L (ref 18–29)
CREATININE: 0.87 mg/dL (ref 0.76–1.27)
Calcium: 9.5 mg/dL (ref 8.6–10.2)
GFR calc Af Amer: 96 mL/min/{1.73_m2} (ref >59)
GFR, EST NON AFRICAN AMERICAN: 83 mL/min/{1.73_m2} (ref >59)
Globulin, Total: 2.1 g/dL (ref 1.5–4.5)
Glucose: 201 mg/dL — ABNORMAL HIGH (ref 65–99)
Potassium: 4.3 mmol/L (ref 3.5–5.2)
SODIUM: 142 mmol/L (ref 134–144)
Total Protein: 6 g/dL (ref 6.0–8.5)

## 2014-09-30 LAB — C-REACTIVE PROTEIN: CRP: 3.4 mg/L (ref 0.0–4.9)

## 2014-09-30 LAB — SEDIMENTATION RATE: Sed Rate: 8 mm/hr (ref 0–30)

## 2014-09-30 NOTE — Telephone Encounter (Signed)
Called [facility] and spoke with Caryl Asp, [place] at facility. She let pt talk with me on the phone and I talked with pt about normal lab results. Pt verbalized understanding.

## 2014-09-30 NOTE — Telephone Encounter (Signed)
Called and spoke with son. No DPR on file. Could not give lab results. Son advised me to contact [facility] center.

## 2014-10-06 ENCOUNTER — Telehealth: Payer: Self-pay | Admitting: *Deleted

## 2014-10-06 NOTE — Telephone Encounter (Signed)
Talked with Lattie Haw from [facility] and she gave me the fax number [phone] to fax over recent office visit notes for pt. I faxed office visit note at 8:45 am on 10/06/14.

## 2014-11-09 IMAGING — CR DG CHEST 2V
2 series · 2 of 2 positions shown · non-contrast
Comparison: 02/01/2014

CLINICAL DATA: Hyperglycemia.  Diabetes.  Hypertension.

EXAM:
CHEST  2 VIEW

[w chest pa]
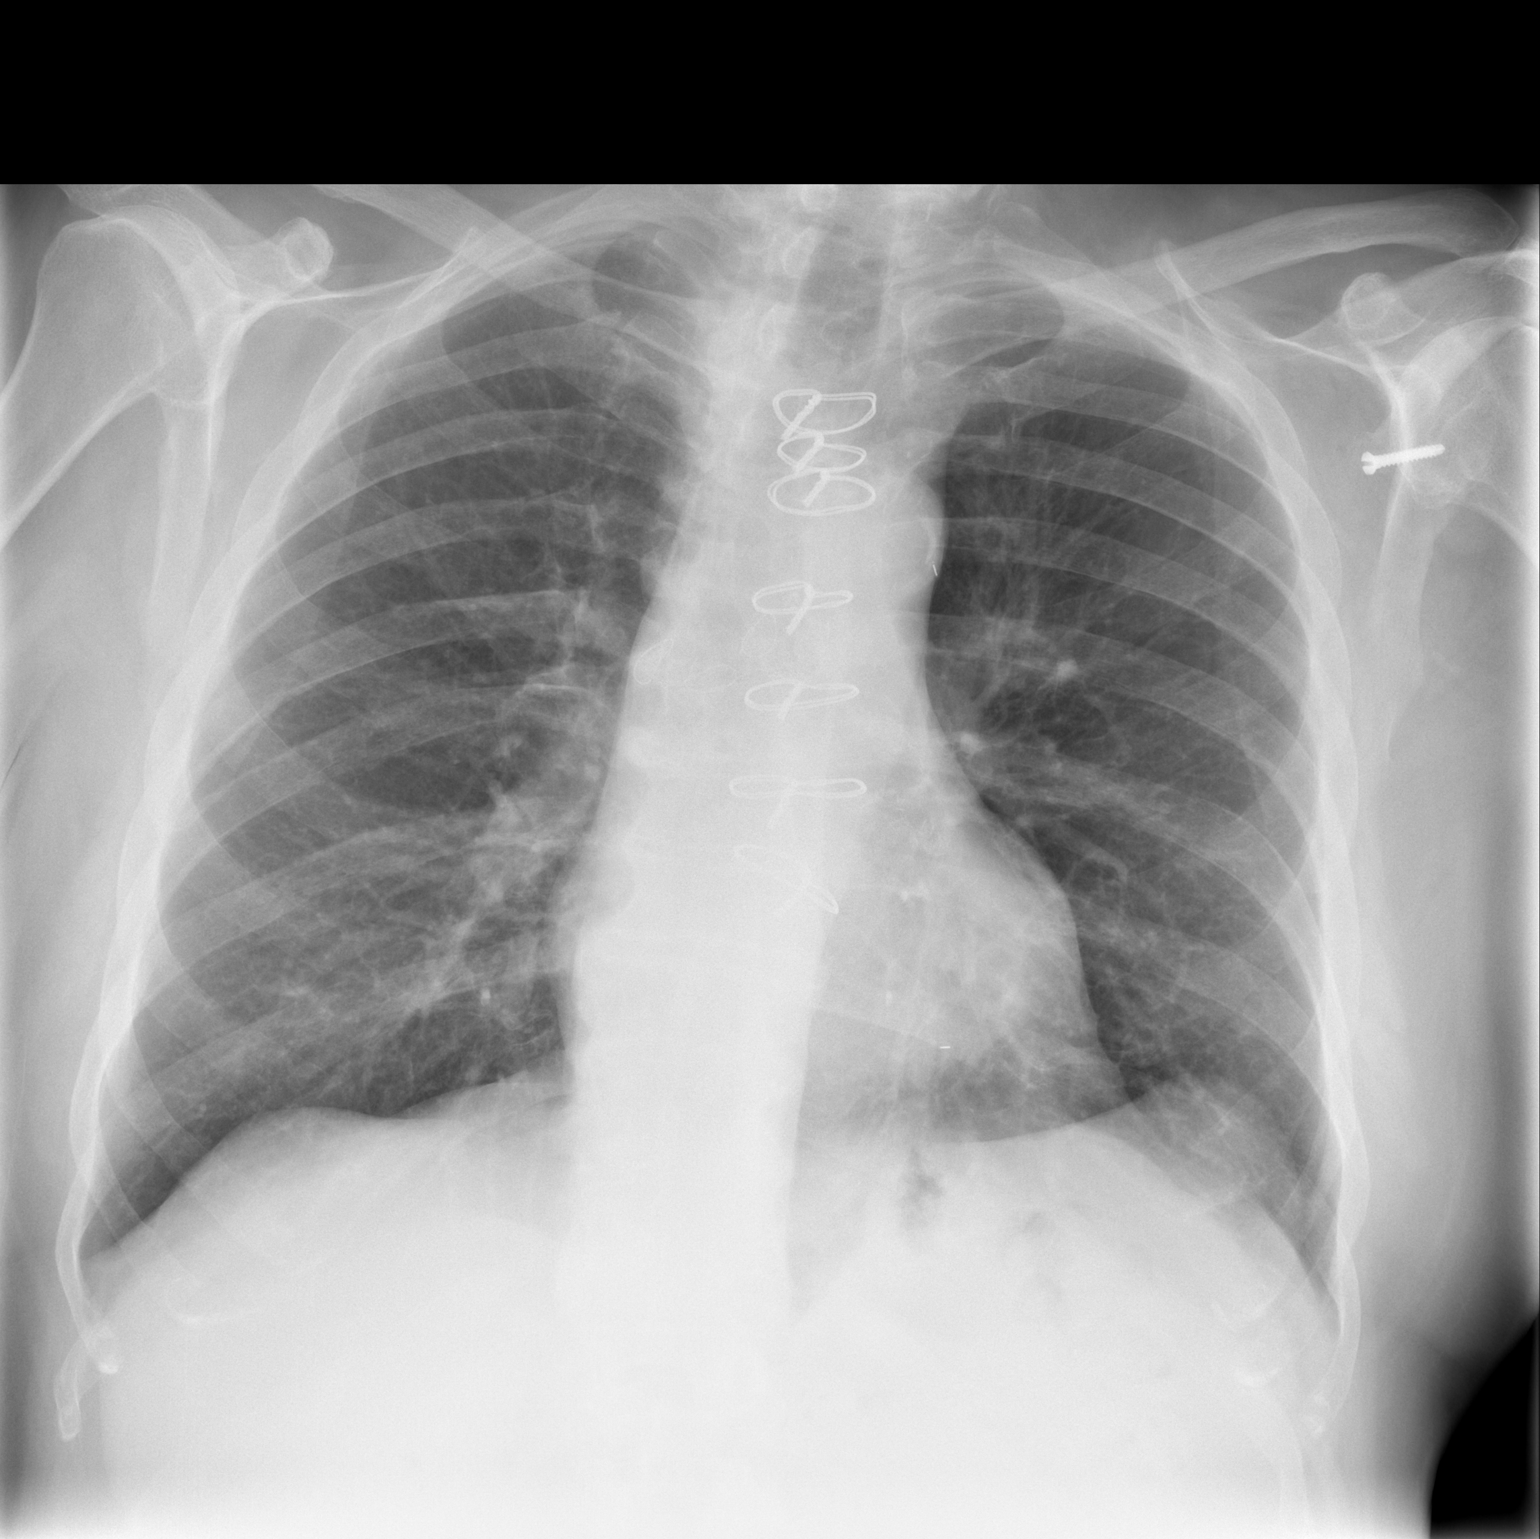

[w chest lat]
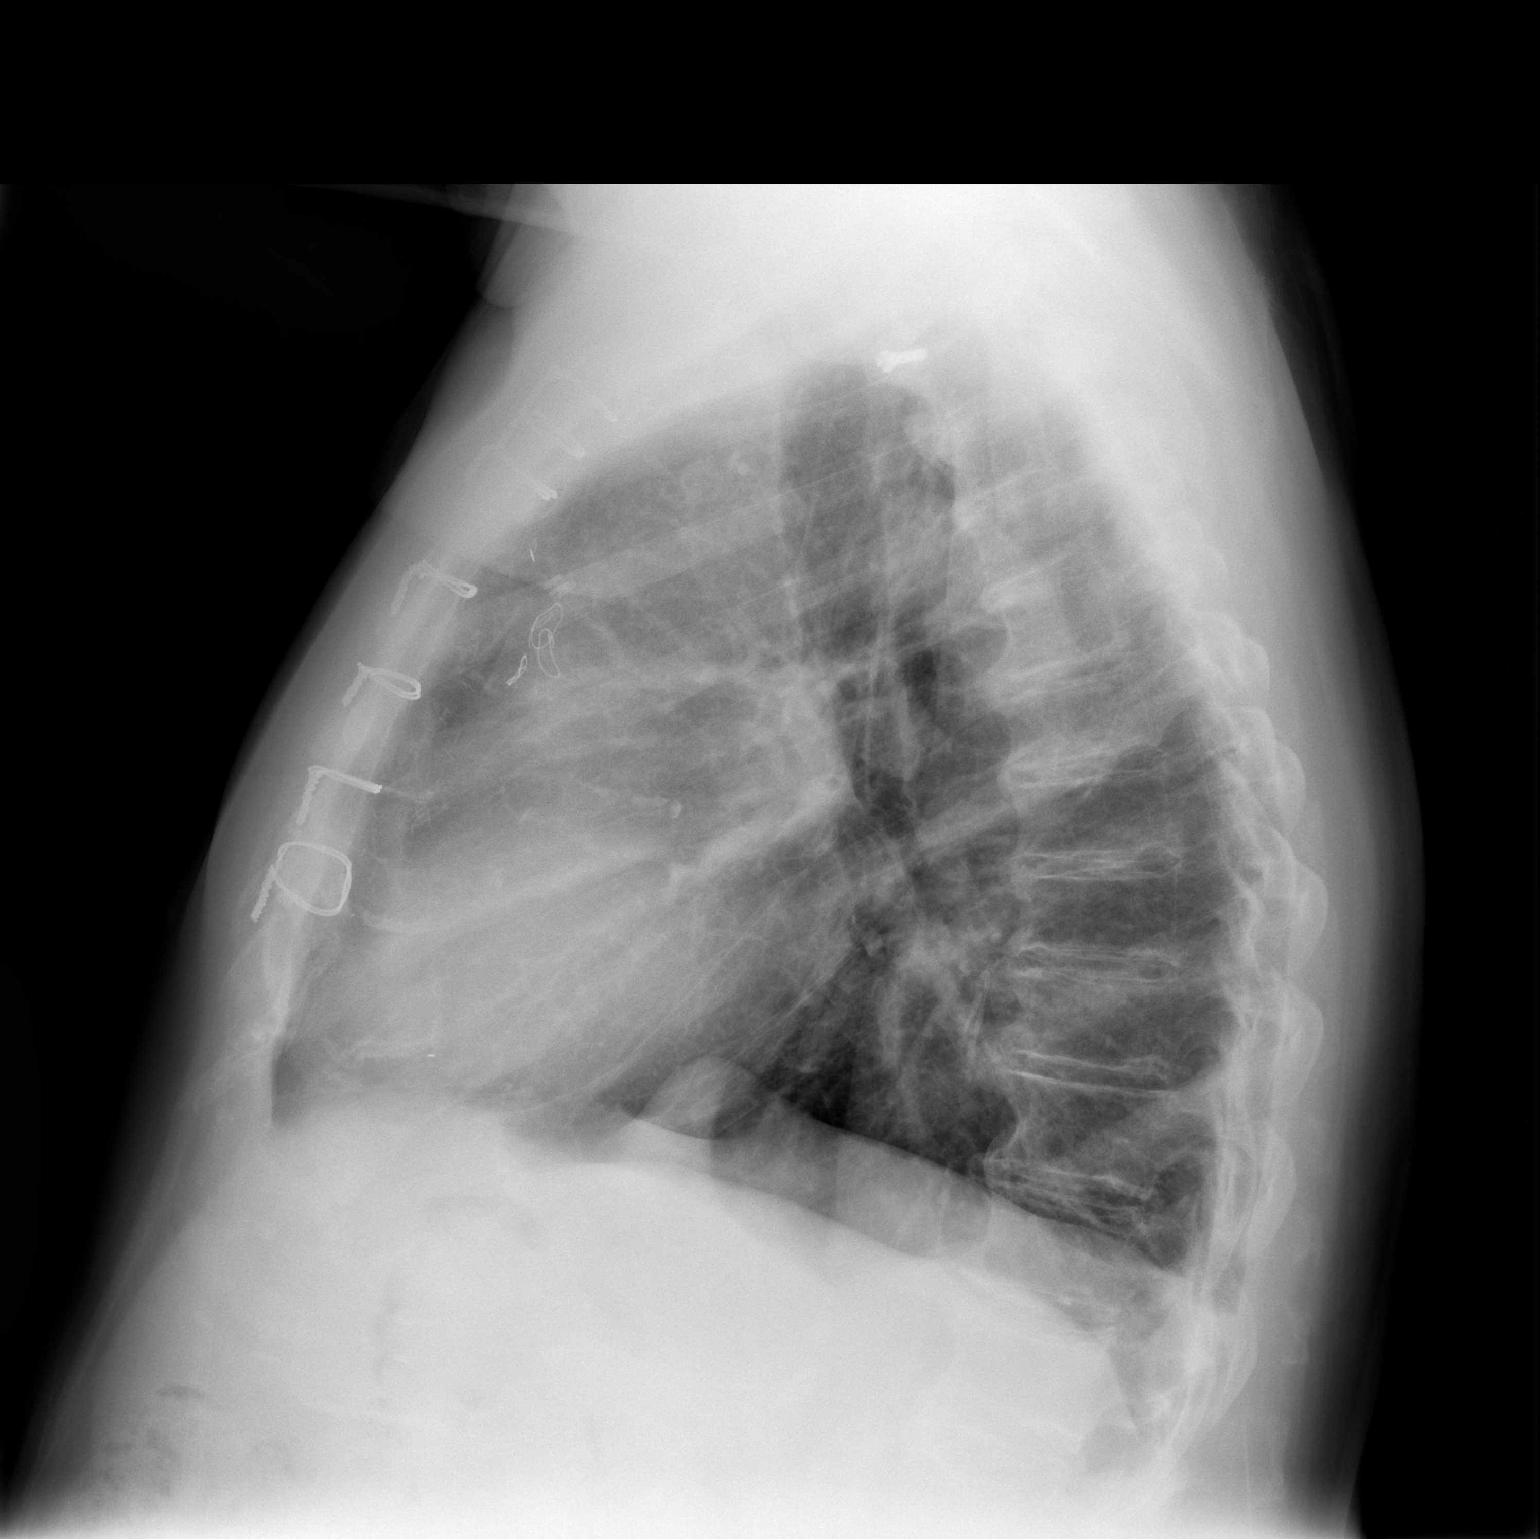

[2 of 2 positions shown; findings below may reference images not displayed]

FINDINGS: Prior CABG. Atherosclerotic aortic arch. Stable slightly lobular
left hemidiaphragm. Degenerative glenohumeral arthropathy on the
left with a left-sided scapular screw.

Thoracic spondylosis.  No pleural effusion identified.
IMPRESSION: 1. Prior CABG, without cardiomegaly or edema.
2. Overall stable appearance of the chest without acute findings.

## 2014-12-22 ENCOUNTER — Ambulatory Visit: Payer: Medicare Other | Admitting: Neurology

## 2015-03-03 DEATH — deceased
